# Patient Record
Sex: Female | Born: 1985
Health system: Southern US, Community
[De-identification: ages and names within clinical notes are randomized; demographics above are authoritative.]

## PROBLEM LIST (undated history)

## (undated) DIAGNOSIS — Z789 Other specified health status: Secondary | ICD-10-CM

## (undated) DIAGNOSIS — R87629 Unspecified abnormal cytological findings in specimens from vagina: Secondary | ICD-10-CM

## (undated) DIAGNOSIS — F419 Anxiety disorder, unspecified: Secondary | ICD-10-CM

## (undated) HISTORY — DX: Unspecified abnormal cytological findings in specimens from vagina: R87.629

## (undated) HISTORY — PX: OTHER SURGICAL HISTORY: SHX169

## (undated) HISTORY — DX: Anxiety disorder, unspecified: F41.9

---

## 2008-09-29 ENCOUNTER — Ambulatory Visit (HOSPITAL_COMMUNITY): Admission: RE | Admit: 2008-09-29 | Discharge: 2008-09-29 | Payer: Self-pay | Admitting: Obstetrics and Gynecology

## 2008-10-25 ENCOUNTER — Inpatient Hospital Stay (HOSPITAL_COMMUNITY): Admission: AD | Admit: 2008-10-25 | Discharge: 2008-10-25 | Payer: Self-pay | Admitting: Obstetrics and Gynecology

## 2008-11-01 ENCOUNTER — Ambulatory Visit (HOSPITAL_COMMUNITY): Admission: RE | Admit: 2008-11-01 | Discharge: 2008-11-01 | Payer: Self-pay | Admitting: Obstetrics and Gynecology

## 2008-11-04 ENCOUNTER — Ambulatory Visit (HOSPITAL_COMMUNITY): Admission: RE | Admit: 2008-11-04 | Discharge: 2008-11-04 | Payer: Self-pay | Admitting: Obstetrics and Gynecology

## 2008-11-11 ENCOUNTER — Ambulatory Visit (HOSPITAL_COMMUNITY): Admission: RE | Admit: 2008-11-11 | Discharge: 2008-11-11 | Payer: Self-pay | Admitting: Obstetrics and Gynecology

## 2008-11-15 ENCOUNTER — Inpatient Hospital Stay (HOSPITAL_COMMUNITY): Admission: RE | Admit: 2008-11-15 | Discharge: 2008-11-17 | Payer: Self-pay | Admitting: Obstetrics and Gynecology

## 2010-06-01 LAB — CBC
Hemoglobin: 11.8 g/dL — ABNORMAL LOW (ref 12.0–15.0)
MCHC: 33.6 g/dL (ref 30.0–36.0)
MCHC: 33.7 g/dL (ref 30.0–36.0)
MCV: 88.3 fL (ref 78.0–100.0)
Platelets: 224 10*3/uL (ref 150–400)
Platelets: 247 10*3/uL (ref 150–400)
RDW: 13.4 % (ref 11.5–15.5)
WBC: 13.2 10*3/uL — ABNORMAL HIGH (ref 4.0–10.5)

## 2010-06-01 LAB — RH IMMUNE GLOB WKUP(>/=20WKS)(NOT WOMEN'S HOSP)

## 2010-09-05 ENCOUNTER — Encounter: Payer: Self-pay | Admitting: Family Medicine

## 2010-11-24 ENCOUNTER — Inpatient Hospital Stay (INDEPENDENT_AMBULATORY_CARE_PROVIDER_SITE_OTHER)
Admission: RE | Admit: 2010-11-24 | Discharge: 2010-11-24 | Disposition: A | Discharge status: Home / Self Care | Payer: 59 | Source: Ambulatory Visit | Attending: Emergency Medicine | Admitting: Emergency Medicine

## 2010-11-24 DIAGNOSIS — J069 Acute upper respiratory infection, unspecified: Secondary | ICD-10-CM

## 2010-11-27 IMAGING — US US UA DOPPLER RE-EVAL
1 series · 18 of 23 positions shown · non-contrast
Comparison: none

OBSTETRICAL ULTRASOUND:
 This ultrasound was performed in The [HOSPITAL], and the [facility] report will be stored to [REDACTED] PACS.

[Series 1: us ua doppler re-eval · 18 of 23 slices shown]
[im 1/23]
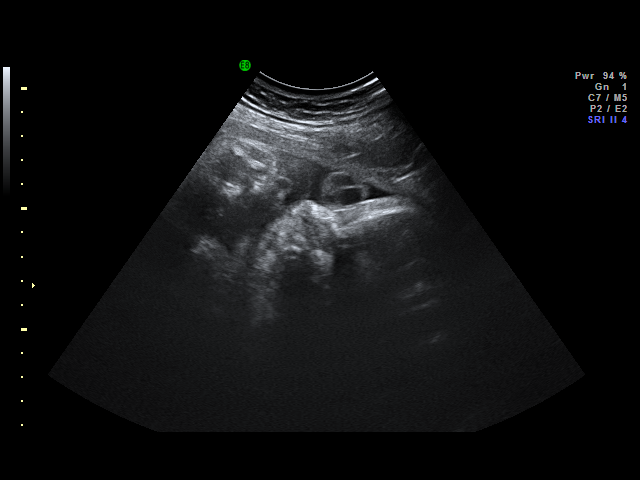
[im 2/23]
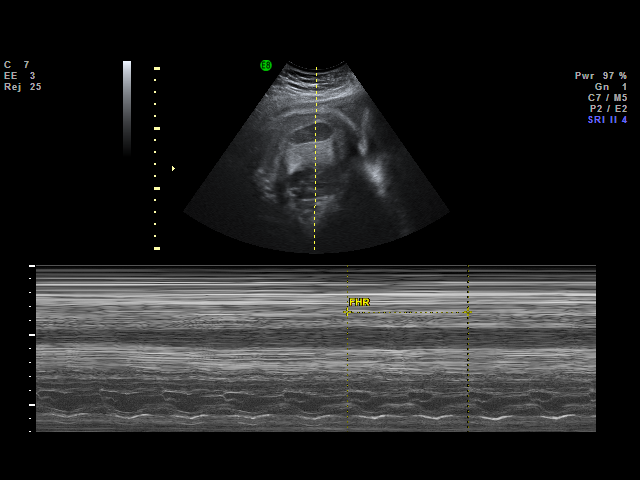
[im 4/23]
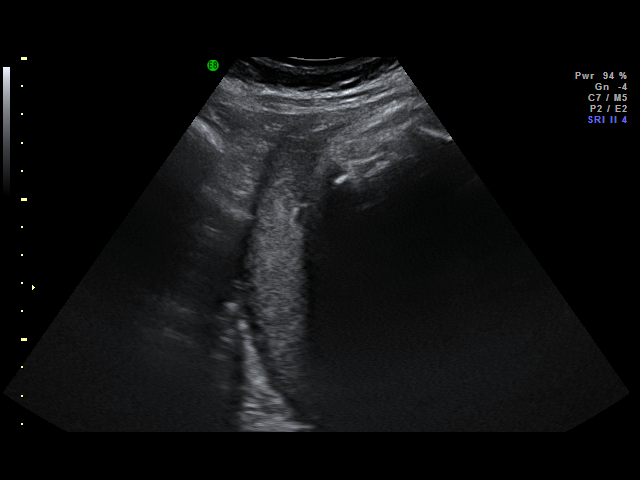
[im 5/23]
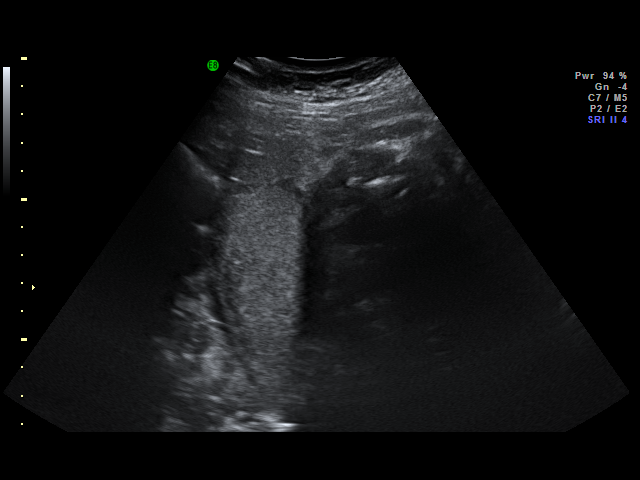
[im 6/23]
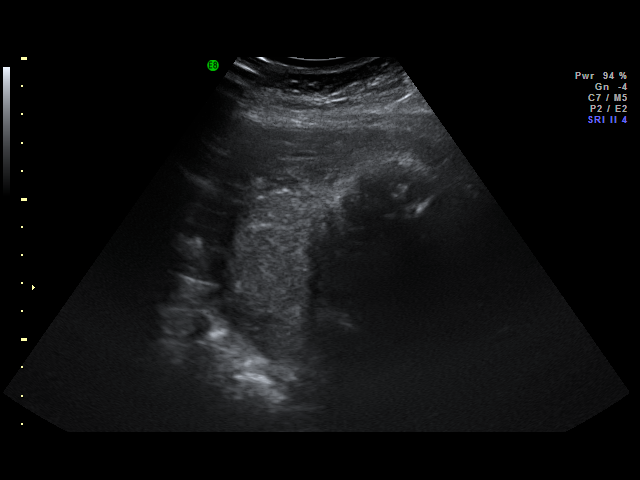
[im 8/23]
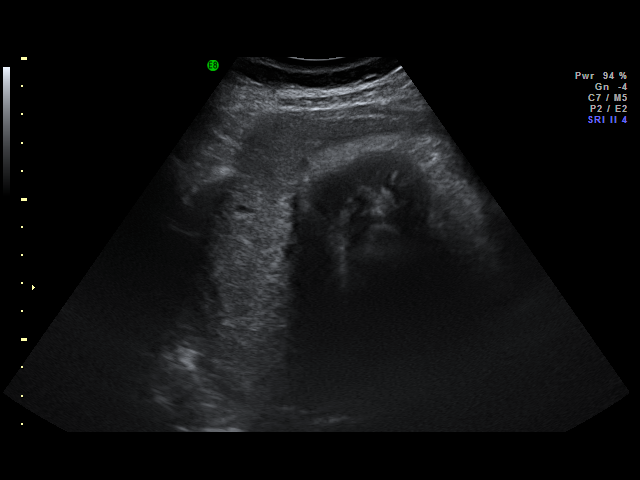
[im 9/23]
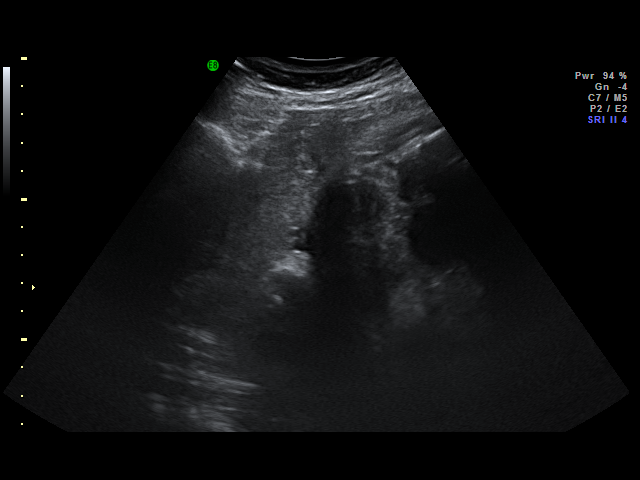
[im 10/23]
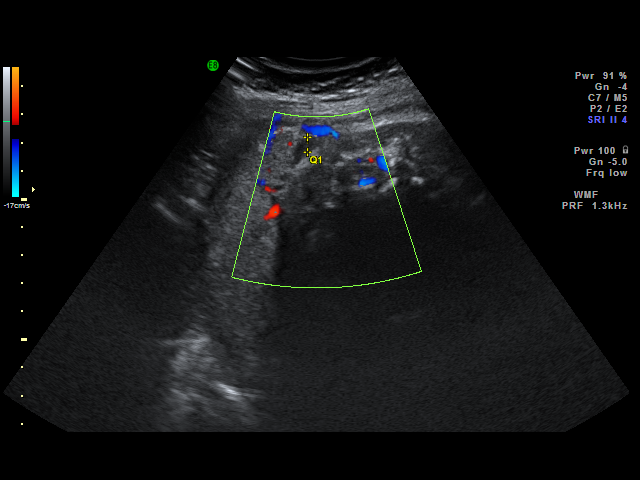
[im 11/23]
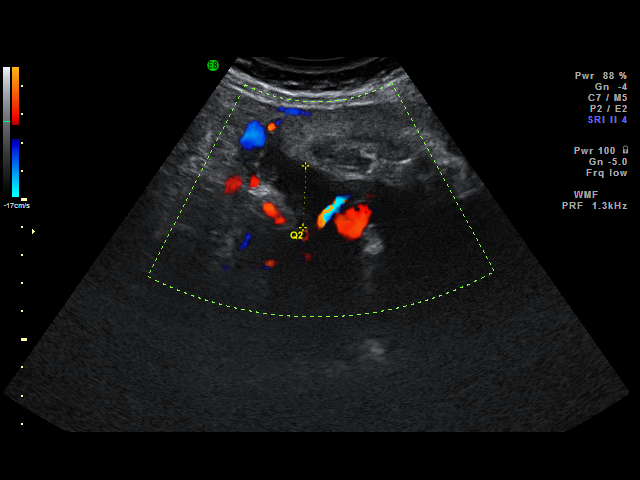
[im 13/23]
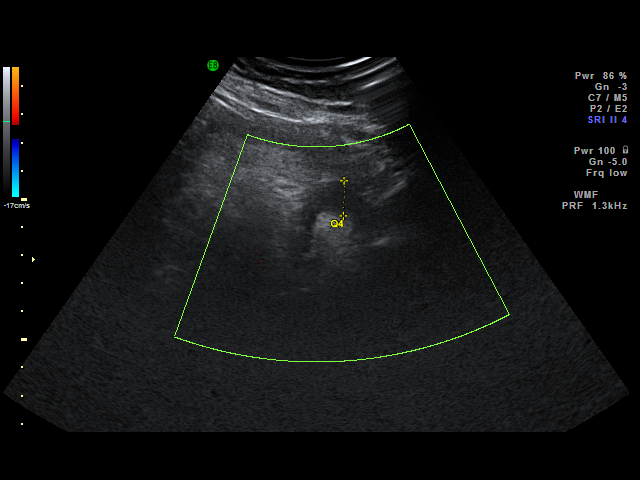
[im 14/23]
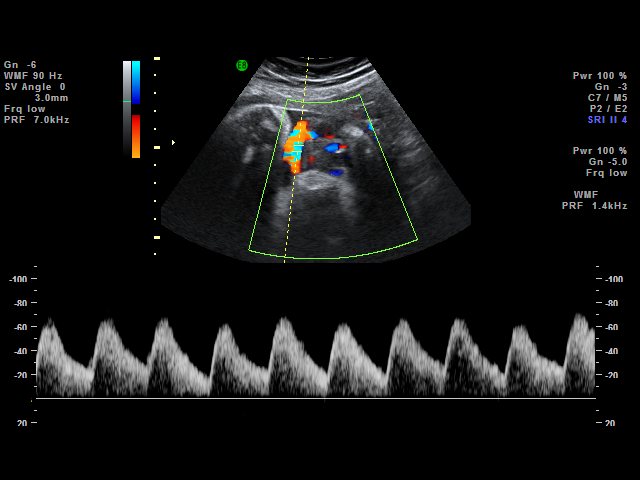
[im 15/23]
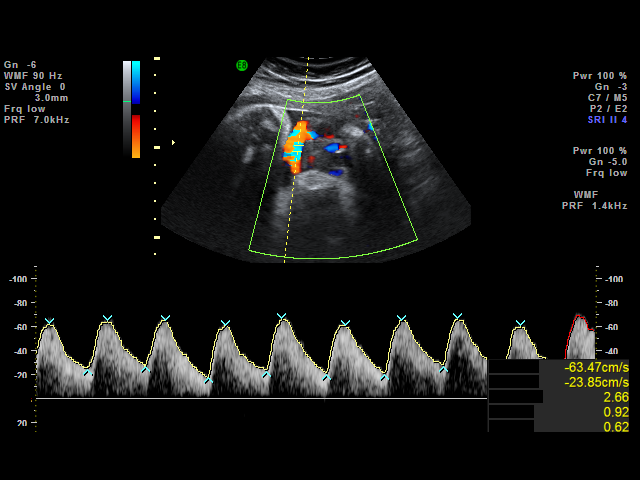
[im 16/23]
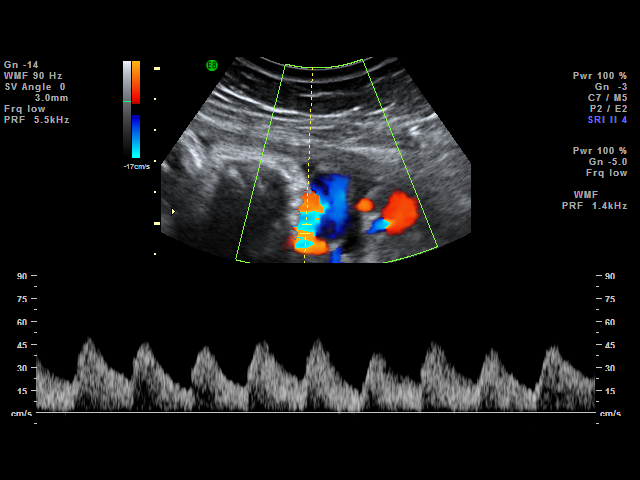
[im 18/23]
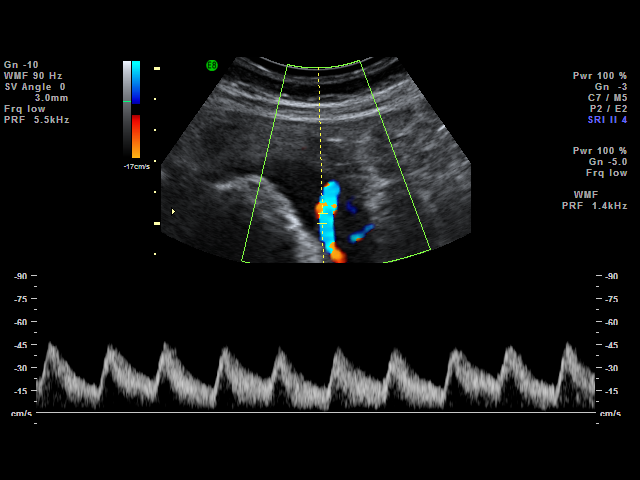
[im 19/23]
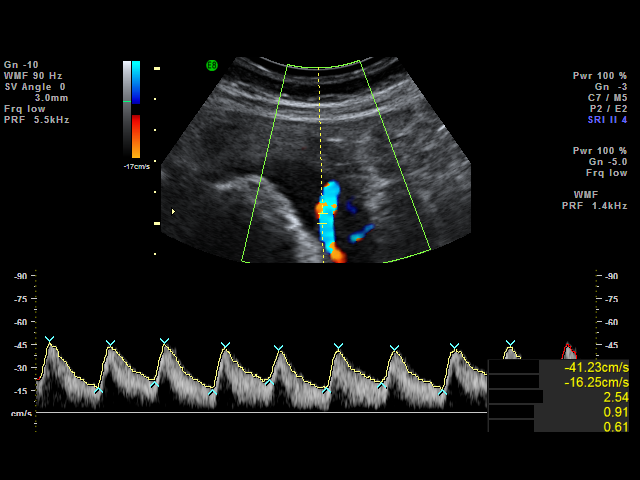
[im 20/23]
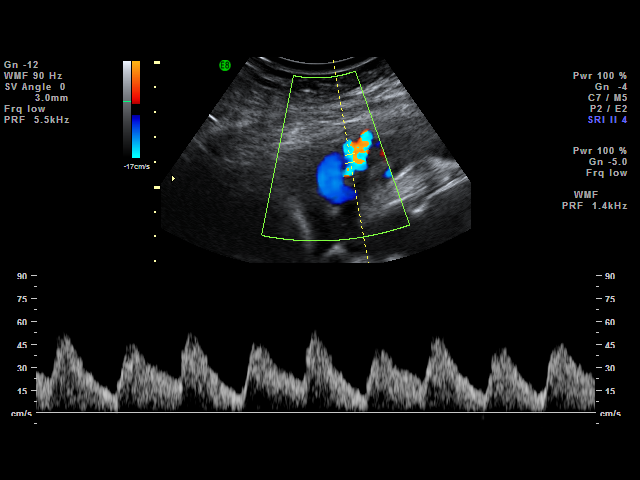
[im 22/23]
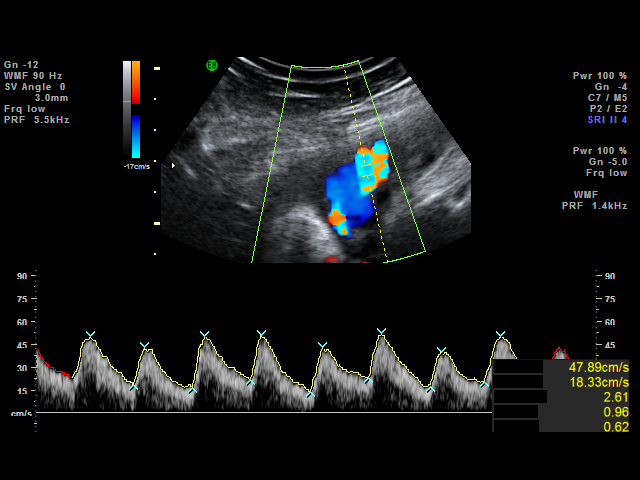
[im 23/23]
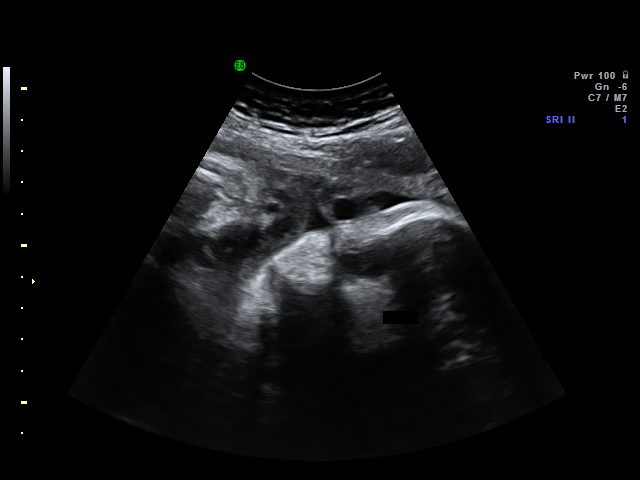

[18 of 23 positions shown; findings below may reference images not displayed]

IMPRESSION: [facility] has also been faxed to the ordering physician.

## 2010-12-07 IMAGING — US US OB LIMITED
1 series · 14 of 23 positions shown · non-contrast
Comparison: none

OBSTETRICAL ULTRASOUND:
 This ultrasound was performed in The [HOSPITAL], and the [facility] report will be stored to [REDACTED] PACS.  This report is also available in [HOSPITAL]?s accessANYware.

[Series 1: us ob limited · 14 of 23 slices shown]
[im 1/23]
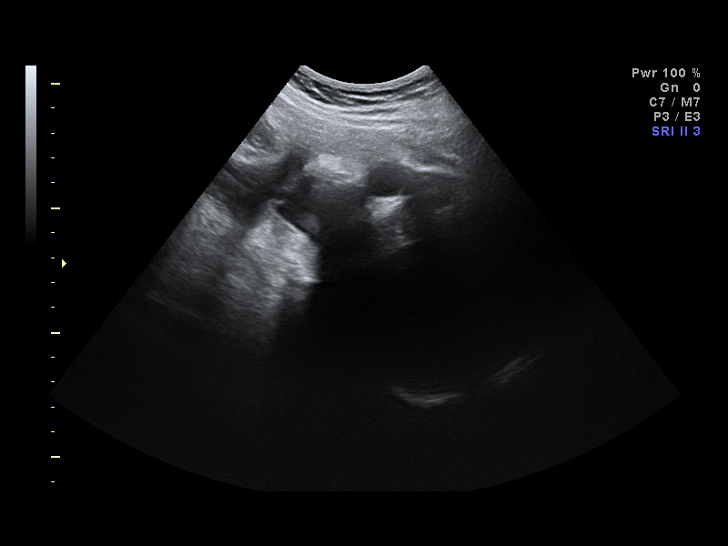
[im 3/23]
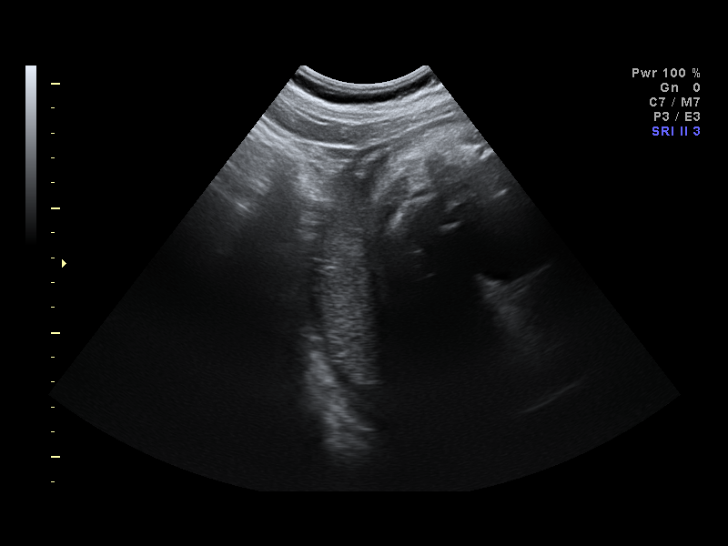
[im 5/23]
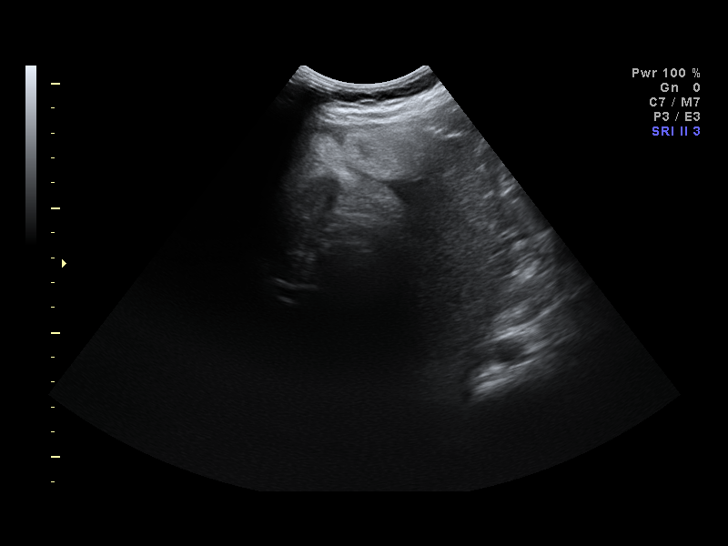
[im 6/23]
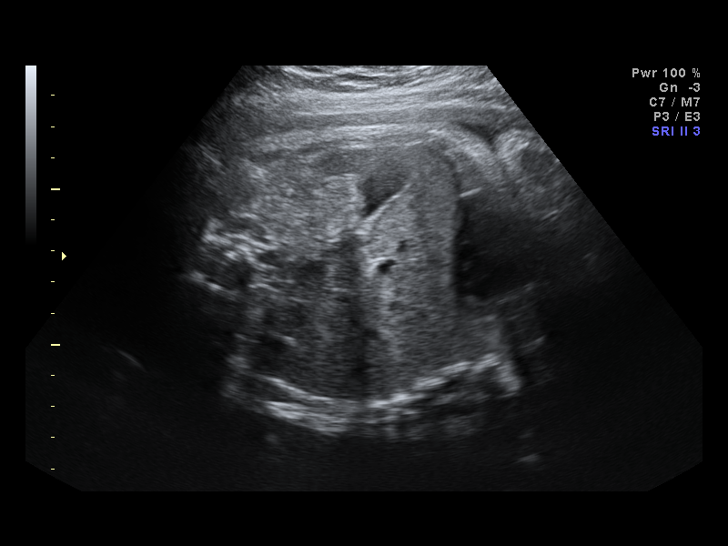
[im 8/23]
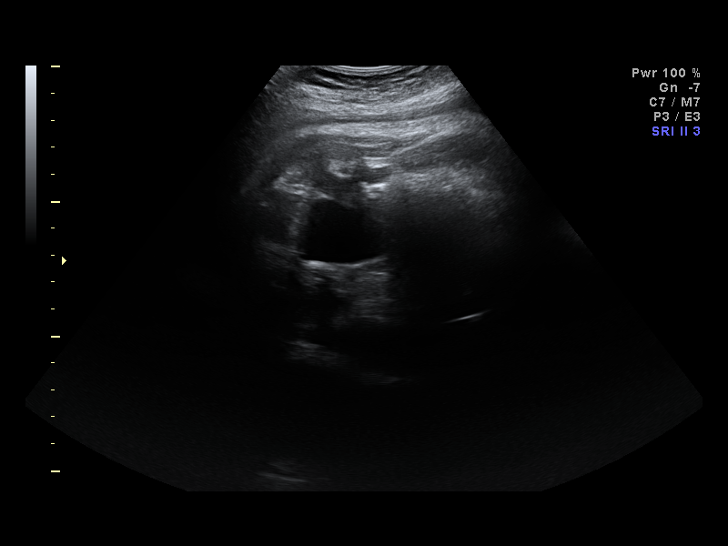
[im 10/23]
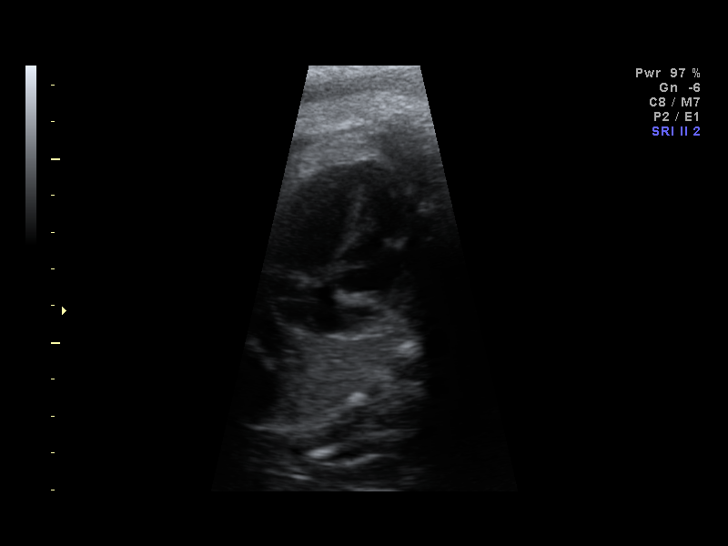
[im 11/23]
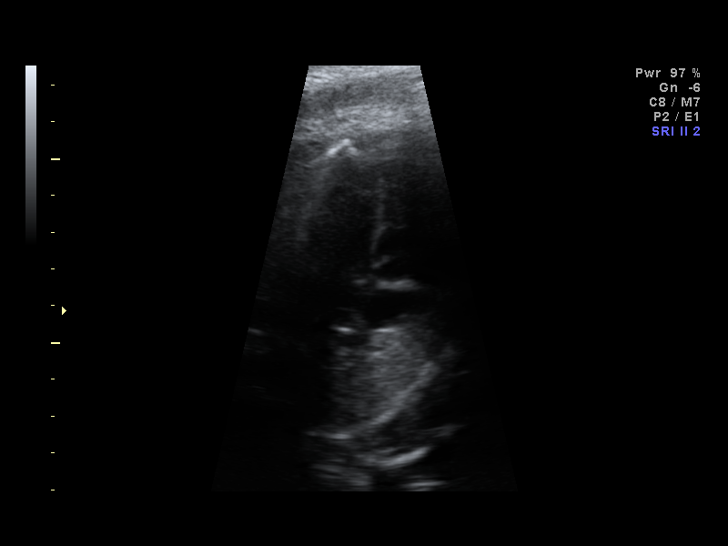
[im 13/23]
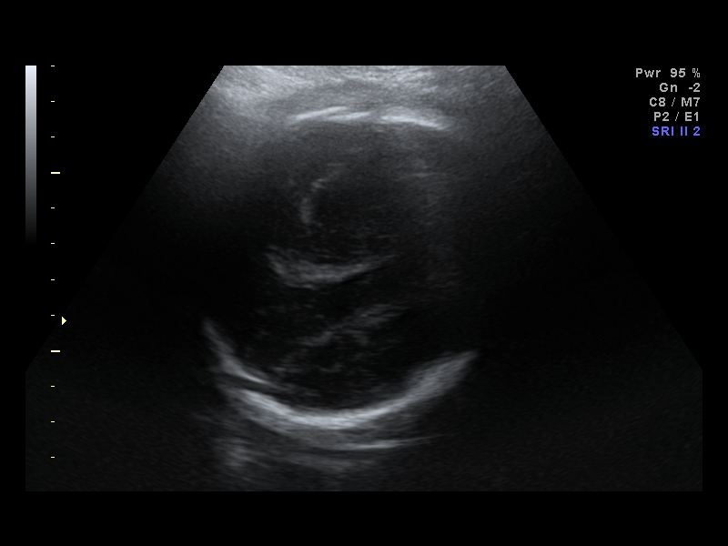
[im 14/23]
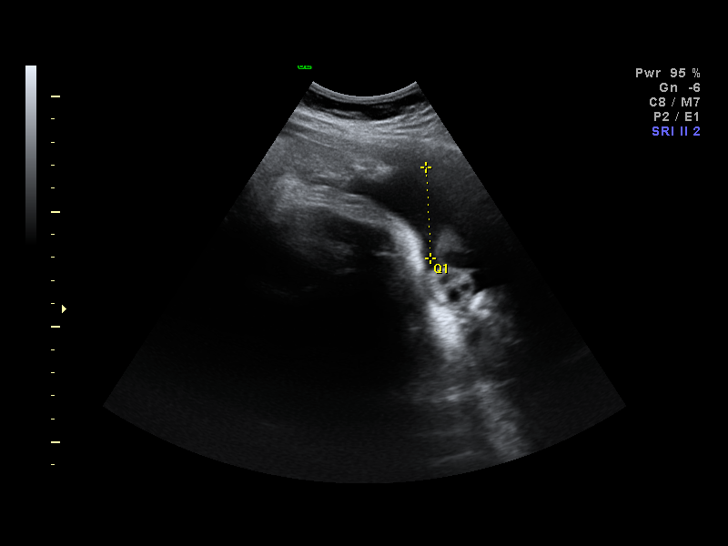
[im 16/23]
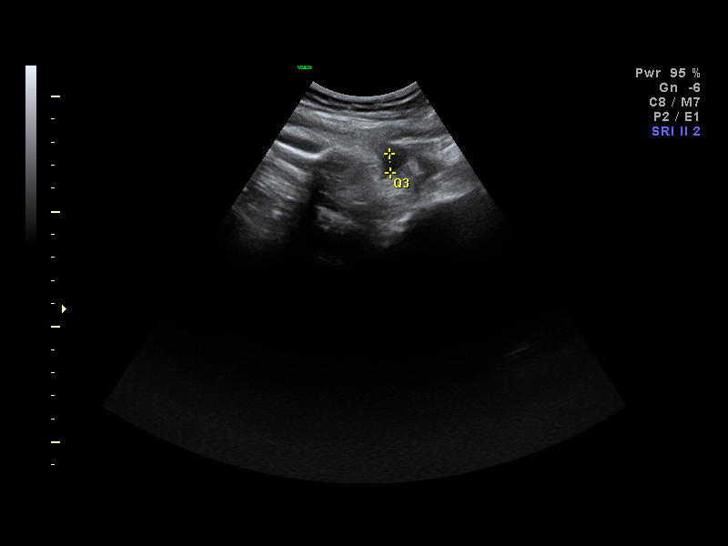
[im 18/23]
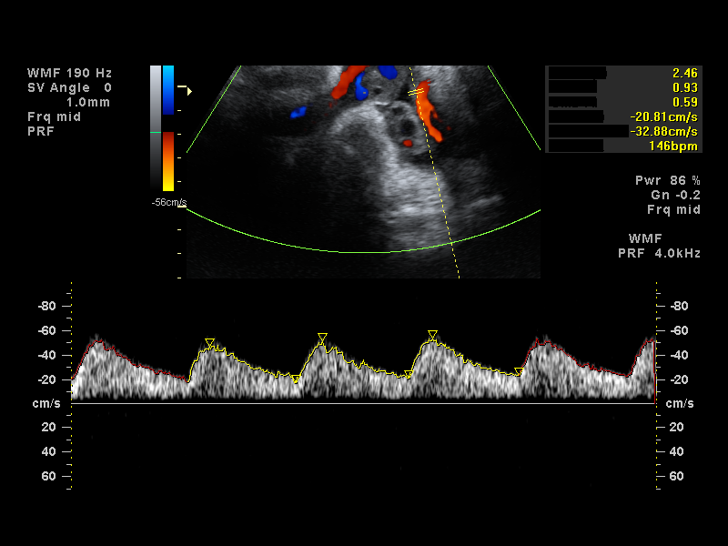
[im 19/23]
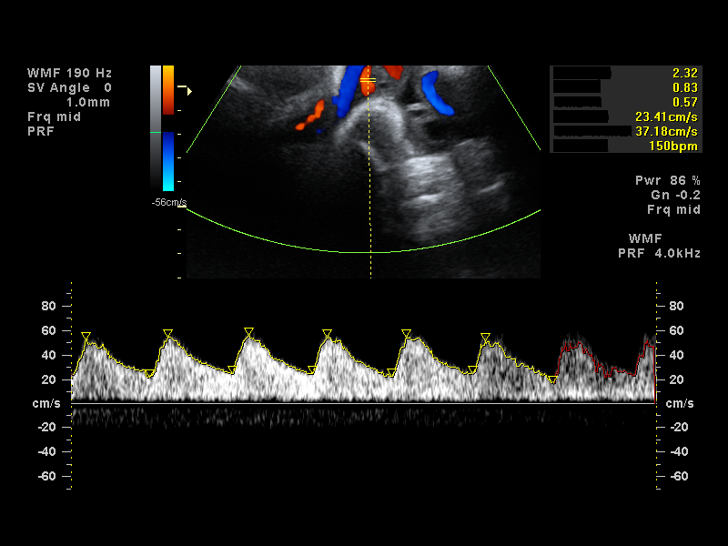
[im 21/23]
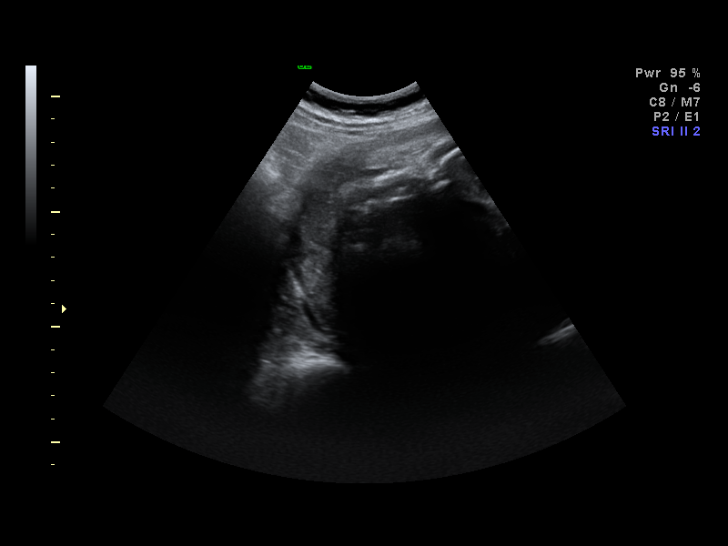
[im 23/23]
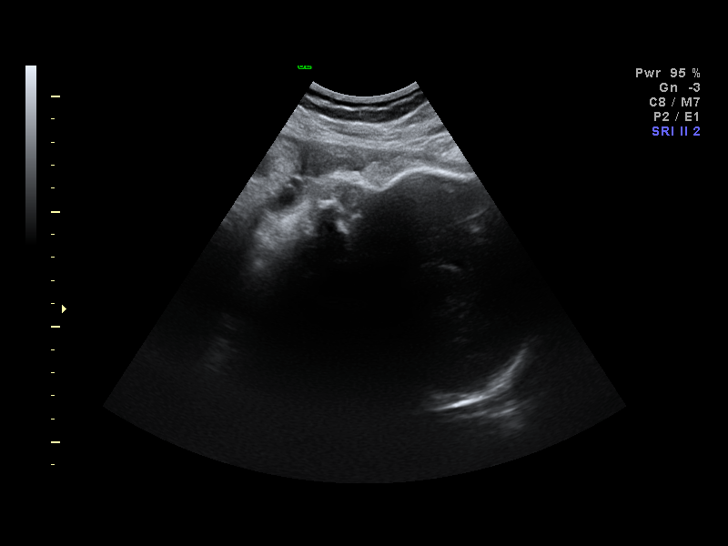

[14 of 23 positions shown; findings below may reference images not displayed]

IMPRESSION: [facility] has also been faxed to the ordering physician.

## 2011-01-17 ENCOUNTER — Inpatient Hospital Stay (HOSPITAL_COMMUNITY)
Admission: AD | Admit: 2011-01-17 | Discharge: 2011-01-17 | Disposition: A | Discharge status: Home / Self Care | Payer: 59 | Source: Ambulatory Visit | Attending: Obstetrics and Gynecology | Admitting: Obstetrics and Gynecology

## 2011-01-17 ENCOUNTER — Encounter (HOSPITAL_COMMUNITY): Payer: Self-pay | Admitting: *Deleted

## 2011-01-17 DIAGNOSIS — O4700 False labor before 37 completed weeks of gestation, unspecified trimester: Secondary | ICD-10-CM | POA: Insufficient documentation

## 2011-01-17 HISTORY — DX: Other specified health status: Z78.9

## 2011-01-17 LAB — URINE MICROSCOPIC-ADD ON

## 2011-01-17 LAB — URINALYSIS, ROUTINE W REFLEX MICROSCOPIC
Glucose, UA: NEGATIVE mg/dL
Hgb urine dipstick: NEGATIVE
Specific Gravity, Urine: >1.03 — ABNORMAL HIGH (ref 1.005–1.030)

## 2011-01-17 LAB — FETAL FIBRONECTIN: Fetal Fibronectin: NEGATIVE

## 2011-01-17 MED ORDER — LACTATED RINGERS IV SOLN
Freq: Once | INTRAVENOUS | Status: AC
  Administered 2011-01-17: 08:00:00 via INTRAVENOUS

## 2011-01-17 NOTE — ED Notes (Signed)
Madeline Owens is a 25 y.o. G2P1001 at [redacted]w[redacted]d by LMP here for contractions  Subjective:    Not feeling ctx's, lots of FM, no LOF or bleeding  Objective: BP 110/69  Pulse 90  Temp 98 F (36.7 C)  Resp 16  Ht 5\' 3"  (1.6 m)  Wt 64.864 kg (143 lb)  BMI 25.33 kg/m2       FHT:  FHR: 140 bpm, variability: moderate,  accelerations:  Present,  decelerations:  Absent UC:   irregular, every 5-8 min minutes and irritability SVE:   Dilation: 1.5 Effacement (%): 60 Station: -1 Exam by:: Renae Fickle, CNM  Labs: Results for orders placed during the hospital encounter of 01/17/11 (from the past 24 hour(s))  URINALYSIS, ROUTINE W REFLEX MICROSCOPIC     Status: Abnormal   Collection Time   01/17/11  5:55 AM      Component Value Range   Color, Urine YELLOW  YELLOW    Appearance CLEAR  CLEAR    Specific Gravity, Urine >1.030 (*) 1.005 - 1.030    pH 6.0  5.0 - 8.0    Glucose, UA NEGATIVE  NEGATIVE (mg/dL)   Hgb urine dipstick NEGATIVE  NEGATIVE    Bilirubin Urine NEGATIVE  NEGATIVE    Ketones, ur NEGATIVE  NEGATIVE (mg/dL)   Protein, ur NEGATIVE  NEGATIVE (mg/dL)   Urobilinogen, UA 0.2  0.0 - 1.0 (mg/dL)   Nitrite NEGATIVE  NEGATIVE    Leukocytes, UA TRACE (*) NEGATIVE   URINE MICROSCOPIC-ADD ON     Status: Abnormal   Collection Time   01/17/11  5:55 AM      Component Value Range   Squamous Epithelial / LPF MANY (*) RARE    WBC, UA 3-6  <3 (WBC/hpf)   Bacteria, UA RARE  RARE   FETAL FIBRONECTIN     Status: Normal   Collection Time   01/17/11  6:40 AM      Component Value Range   Fetal Fibronectin NEGATIVE  NEGATIVE     Assessment / Plan: IUP at [redacted]w[redacted]d, contractions  CVX exam unchanged fFN negative Low risk for PTB with previous term delivery and neg fFN today  D/C home after IV fluids complete PTL precautions reviewed F/U in office next week.  PAUL,DANIELA 01/17/2011, 7:54 AM

## 2011-01-17 NOTE — Progress Notes (Signed)
Colon Flattery, CNM at bedside.  Strip reviewed and poc discussed with pt.

## 2011-01-17 NOTE — Progress Notes (Signed)
FFN collected prior to cervical exam.

## 2011-01-17 NOTE — Progress Notes (Signed)
Pt G2 P1 at 32.2wks having contractions all night.  Pt denies bleeding, leaking or problems with pregnancy.

## 2011-01-17 NOTE — Progress Notes (Signed)
Notified of 32.2 weeks here for cramping all night.  Notified of hx of previa that pt states is resolved.  Orders for FFN and cervical check.  Will be up to see pt.

## 2011-01-17 NOTE — ED Provider Notes (Signed)
Madeline Owens is a 25 y.o. female at [redacted]w[redacted]d presenting for persistent contractions overnight. Reports ctx started last night, and continued every few minutes until presenting to hospital. Now does not feel any contractions. Denies LOF or VB. Reports good fetal movement. Drinks orange juice, minimal water. Had GI upset the day before with stomach cramps and loose stools which have now stopped. Hx SVD at 39 wks, IOL for oligo.   Maternal Medical History:  Reason for admission: Reason for Admission:   nausea  OB History    Grav Para Term Preterm Abortions TAB SAB Ect Mult Living   2 1 1  0 0 0 0 0 0 1     Past Medical History  Diagnosis Date  . No pertinent past medical history    Past Surgical History  Procedure Date  . Wisdom teeth    Family History: family history includes Diabetes in her maternal grandmother, paternal grandfather, and paternal grandmother; Heart disease in her paternal grandfather; and Hypertension in her father. Social History:  reports that she has never smoked. She does not have any smokeless tobacco history on file. She reports that she does not drink alcohol or use illicit drugs.  Review of Systems  Constitutional: Negative for fever.  Gastrointestinal: Negative for nausea, vomiting and abdominal pain.  Genitourinary: Negative for dysuria, urgency and frequency.  Neurological: Negative for headaches.    Dilation: 1 (external os 1cm, unable to reach internal) Effacement (%): 50;40 Station: -3 Exam by:: Humphrey Rolls, RN Blood pressure 110/69, pulse 90, temperature 98 F (36.7 C), resp. rate 16, height 5\' 3"  (1.6 m), weight 64.864 kg (143 lb). Maternal Exam:  Uterine Assessment: Contraction strength is mild.  Contraction frequency is irregular.  Q 5-7 min with irritability in between  Abdomen: Patient reports no abdominal tenderness. Fundal height is 30.   Fetal presentation: vertex  Introitus: Normal vulva. Normal vagina.    Fetal Exam Fetal Monitor  Review: Mode: ultrasound.   Baseline rate: 140.  Variability: moderate (6-25 bpm).   Pattern: accelerations present and no decelerations.    Fetal State Assessment: Category I - tracings are normal.     Physical Exam  Constitutional: She is oriented to person, place, and time. She appears well-developed and well-nourished.  HENT:  Head: Normocephalic.  Neck: Normal range of motion. Neck supple.  Cardiovascular: Normal rate and regular rhythm.   Respiratory: Effort normal and breath sounds normal.  GI: Soft.  Genitourinary: Vagina normal.  Musculoskeletal: Normal range of motion. She exhibits no edema.  Neurological: She is alert and oriented to person, place, and time.  Skin: Skin is warm and dry.  Psychiatric: She has a normal mood and affect.    Prenatal labs: ABO, Rh:  O neg Antibody:  neg Rubella:  immune RPR:   NR HBsAg:   NR HIV:   NR GBS:  (+)bacteriuria, treated 1GTT: nl Genetics: declined [place]: nl female anatomy, marginal placenta previa resolved by 28 wks  Assessment/Plan: IUP at [redacted]w[redacted]d Threatened PTL, low risk for PTB w/ previous term pregnancy, suspect GI virus sequelae  fFN pending Will hydrate IV LR 1L bolus  Plan D/C home w/ PTL precautions if fFN neg and contractions decrease< 6/hr Plan increased rest next couple of days.  PAUL,DANIELA 01/17/2011, 7:04 AM

## 2011-02-26 NOTE — L&D Delivery Note (Signed)
  Delivery Note  Complete dilation at 1215 with BBOW - AROM with clear fluid Received ampicillin 2gm IVPB x 1 dose for + GBS status Onset of pushing at 1225 FHR second stage 100-110  Anesthesia: epidural  Delivery of a viable female at 59 by CNM in ROA position No nuchal cord Cord double clamped after cessation of pulsation, cut by FOB Cord blood sample & cord blood donation collected by [facility] Staff after placental delivery.  Placenta delivered shultz at 1249 intact with 3 VC.  Placenta sent from room for cord blood collection & subsequent disposal. Uterine tone firm/ bleeding small  small 1st degree perineal laceration identified - repair with 3-0 vicryl subcuticular superficial upper left labial LAC - single 4-0 suture placed for hemostasis  Est. Blood Loss (mL): 300  Complications: none  Apgar 9-9. Birth weight 7-5.  Mom to postpartum.  Baby to nursery-stable.  Deaire Mcwhirter 03/06/2011, 1:14 PM

## 2011-03-06 ENCOUNTER — Encounter (HOSPITAL_COMMUNITY): Payer: Self-pay | Admitting: Anesthesiology

## 2011-03-06 ENCOUNTER — Inpatient Hospital Stay (HOSPITAL_COMMUNITY)
Admission: AD | Admit: 2011-03-06 | Discharge: 2011-03-08 | DRG: 775 | Disposition: A | Discharge status: Home / Self Care | Payer: 59 | Source: Ambulatory Visit | Attending: Obstetrics and Gynecology | Admitting: Obstetrics and Gynecology

## 2011-03-06 ENCOUNTER — Encounter (HOSPITAL_COMMUNITY): Payer: Self-pay | Admitting: *Deleted

## 2011-03-06 ENCOUNTER — Inpatient Hospital Stay (HOSPITAL_COMMUNITY): Payer: 59 | Admitting: Anesthesiology

## 2011-03-06 DIAGNOSIS — O9903 Anemia complicating the puerperium: Secondary | ICD-10-CM | POA: Diagnosis not present

## 2011-03-06 DIAGNOSIS — IMO0001 Reserved for inherently not codable concepts without codable children: Secondary | ICD-10-CM

## 2011-03-06 DIAGNOSIS — D62 Acute posthemorrhagic anemia: Secondary | ICD-10-CM | POA: Diagnosis not present

## 2011-03-06 DIAGNOSIS — O99892 Other specified diseases and conditions complicating childbirth: Secondary | ICD-10-CM | POA: Diagnosis present

## 2011-03-06 DIAGNOSIS — Z22330 Carrier of Group B streptococcus: Secondary | ICD-10-CM

## 2011-03-06 LAB — CBC
HCT: 37.4 % (ref 36.0–46.0)
Hemoglobin: 12 g/dL (ref 12.0–15.0)
MCH: 27.5 pg (ref 26.0–34.0)
MCHC: 32.1 g/dL (ref 30.0–36.0)
MCV: 85.6 fL (ref 78.0–100.0)
Platelets: 218 10*3/uL (ref 150–400)
RBC: 4.37 MIL/uL (ref 3.87–5.11)
RDW: 14.2 % (ref 11.5–15.5)
WBC: 14 10*3/uL — ABNORMAL HIGH (ref 4.0–10.5)

## 2011-03-06 LAB — STREP B DNA PROBE: GBS: POSITIVE

## 2011-03-06 LAB — RPR: RPR Ser Ql: NONREACTIVE

## 2011-03-06 MED ORDER — DIBUCAINE 1 % RE OINT: 1.0000 "application " | TOPICAL_OINTMENT | RECTAL | Status: DC | PRN

## 2011-03-06 MED ORDER — LIDOCAINE HCL 1.5 % IJ SOLN
INTRAMUSCULAR | Status: DC | PRN
  Administered 2011-03-06 (×2): 5 mL via EPIDURAL

## 2011-03-06 MED ORDER — ACETAMINOPHEN 325 MG PO TABS: 650.0000 mg | ORAL_TABLET | ORAL | Status: DC | PRN

## 2011-03-06 MED ORDER — EPHEDRINE 5 MG/ML INJ: 10.0000 mg | INTRAVENOUS | Status: DC | PRN

## 2011-03-06 MED ORDER — ONDANSETRON HCL 4 MG/2ML IJ SOLN: 4.0000 mg | INTRAMUSCULAR | Status: DC | PRN

## 2011-03-06 MED ORDER — SODIUM CHLORIDE 0.9 % IV SOLN
2.0000 g | Freq: Once | INTRAVENOUS | Status: AC
  Administered 2011-03-06: 2 g via INTRAVENOUS
  Filled 2011-03-06: qty 2000

## 2011-03-06 MED ORDER — IBUPROFEN 600 MG PO TABS
600.0000 mg | ORAL_TABLET | Freq: Four times a day (QID) | ORAL | Status: DC
  Administered 2011-03-06 – 2011-03-08 (×7): 600 mg via ORAL
  Filled 2011-03-06 (×7): qty 1

## 2011-03-06 MED ORDER — HYDROCORTISONE 2.5 % RE CREA
TOPICAL_CREAM | Freq: Three times a day (TID) | RECTAL | Status: DC
  Administered 2011-03-08: 11:00:00 via RECTAL
  Filled 2011-03-06: qty 28.35

## 2011-03-06 MED ORDER — OXYCODONE-ACETAMINOPHEN 5-325 MG PO TABS: 1.0000 | ORAL_TABLET | ORAL | Status: DC | PRN

## 2011-03-06 MED ORDER — LACTATED RINGERS IV SOLN
INTRAVENOUS | Status: DC
  Administered 2011-03-06: 10:00:00 via INTRAVENOUS

## 2011-03-06 MED ORDER — BENZOCAINE-MENTHOL 20-0.5 % EX AERO: 1.0000 "application " | INHALATION_SPRAY | CUTANEOUS | Status: DC | PRN

## 2011-03-06 MED ORDER — OXYTOCIN 20 UNITS IN LACTATED RINGERS INFUSION - SIMPLE
500.0000 mL/h | INTRAVENOUS | Status: DC
  Administered 2011-03-06: 500 mL/h via INTRAVENOUS
  Filled 2011-03-06: qty 1000

## 2011-03-06 MED ORDER — IBUPROFEN 600 MG PO TABS: 600.0000 mg | ORAL_TABLET | Freq: Four times a day (QID) | ORAL | Status: DC | PRN

## 2011-03-06 MED ORDER — ONDANSETRON HCL 4 MG PO TABS: 4.0000 mg | ORAL_TABLET | ORAL | Status: DC | PRN

## 2011-03-06 MED ORDER — WITCH HAZEL-GLYCERIN EX PADS: 1.0000 "application " | MEDICATED_PAD | CUTANEOUS | Status: DC | PRN

## 2011-03-06 MED ORDER — SENNOSIDES-DOCUSATE SODIUM 8.6-50 MG PO TABS
2.0000 | ORAL_TABLET | Freq: Every day | ORAL | Status: DC
  Administered 2011-03-06 – 2011-03-07 (×2): 2 via ORAL

## 2011-03-06 MED ORDER — EPHEDRINE 5 MG/ML INJ
INTRAVENOUS | Status: AC
  Filled 2011-03-06: qty 4

## 2011-03-06 MED ORDER — DIPHENHYDRAMINE HCL 25 MG PO CAPS: 25.0000 mg | ORAL_CAPSULE | Freq: Four times a day (QID) | ORAL | Status: DC | PRN

## 2011-03-06 MED ORDER — LANOLIN HYDROUS EX OINT: TOPICAL_OINTMENT | CUTANEOUS | Status: DC | PRN

## 2011-03-06 MED ORDER — LIDOCAINE HCL (PF) 1 % IJ SOLN
30.0000 mL | INTRAMUSCULAR | Status: DC | PRN
  Filled 2011-03-06: qty 30

## 2011-03-06 MED ORDER — FENTANYL 2.5 MCG/ML BUPIVACAINE 1/10 % EPIDURAL INFUSION (WH - ANES): 14.0000 mL/h | INTRAMUSCULAR | Status: DC

## 2011-03-06 MED ORDER — SIMETHICONE 80 MG PO CHEW: 80.0000 mg | CHEWABLE_TABLET | ORAL | Status: DC | PRN

## 2011-03-06 MED ORDER — CITRIC ACID-SODIUM CITRATE 334-500 MG/5ML PO SOLN: 30.0000 mL | ORAL | Status: DC | PRN

## 2011-03-06 MED ORDER — PHENYLEPHRINE 40 MCG/ML (10ML) SYRINGE FOR IV PUSH (FOR BLOOD PRESSURE SUPPORT): 80.0000 ug | PREFILLED_SYRINGE | INTRAVENOUS | Status: DC | PRN

## 2011-03-06 MED ORDER — SODIUM CHLORIDE 0.9 % IV SOLN
2.0000 g | Freq: Four times a day (QID) | INTRAVENOUS | Status: DC
  Filled 2011-03-06: qty 2000

## 2011-03-06 MED ORDER — FENTANYL 2.5 MCG/ML BUPIVACAINE 1/10 % EPIDURAL INFUSION (WH - ANES)
INTRAMUSCULAR | Status: AC
  Filled 2011-03-06: qty 60

## 2011-03-06 MED ORDER — LACTATED RINGERS IV SOLN: 500.0000 mL | Freq: Once | INTRAVENOUS | Status: DC

## 2011-03-06 MED ORDER — COMPLETENATE 29-1 MG PO CHEW
1.0000 | CHEWABLE_TABLET | Freq: Every day | ORAL | Status: DC
  Administered 2011-03-08: 1 via ORAL
  Filled 2011-03-06 (×3): qty 1

## 2011-03-06 MED ORDER — PHENYLEPHRINE 40 MCG/ML (10ML) SYRINGE FOR IV PUSH (FOR BLOOD PRESSURE SUPPORT)
80.0000 ug | PREFILLED_SYRINGE | INTRAVENOUS | Status: DC | PRN
  Filled 2011-03-06: qty 5

## 2011-03-06 MED ORDER — DIPHENHYDRAMINE HCL 50 MG/ML IJ SOLN: 12.5000 mg | INTRAMUSCULAR | Status: DC | PRN

## 2011-03-06 MED ORDER — FENTANYL 2.5 MCG/ML BUPIVACAINE 1/10 % EPIDURAL INFUSION (WH - ANES)
INTRAMUSCULAR | Status: DC | PRN
  Administered 2011-03-06: 14 mL/h via EPIDURAL

## 2011-03-06 MED ORDER — PHENYLEPHRINE 40 MCG/ML (10ML) SYRINGE FOR IV PUSH (FOR BLOOD PRESSURE SUPPORT)
PREFILLED_SYRINGE | INTRAVENOUS | Status: AC
  Filled 2011-03-06: qty 5

## 2011-03-06 NOTE — Progress Notes (Signed)
S: Feeling no pain since epidural / no pressure or urge to push     Epidural in place / single dose ampicillin 2gm IVPB infused for GBS prophylaxis  O:  VS: Blood pressure 119/66, pulse 226, temperature 97.4 F (36.3 C), temperature source Oral, resp. rate 20, height 5' 2.5" (1.588 m), weight 67.586 kg (149 lb), SpO2 100.00%.        FHR : baseline 130 / variability moderate/ accels +         Toco: contractions every 2-3 minutes         Cervix : 10/100/vtx/ BBOW +2        Membranes: AROM - clear  A: Active labor     FHR category 1  P: Begin second stage - anticipate SVB     Susa Loffler, MSN

## 2011-03-06 NOTE — Anesthesia Preprocedure Evaluation (Signed)
Anesthesia Evaluation  Patient identified by MRN, date of birth, ID band Patient awake    Reviewed: Allergy & Precautions, H&P , NPO status , Patient's Chart, lab work & pertinent test results  Airway Mallampati: I TM Distance: >3 FB Neck ROM: full    Dental No notable dental hx.    Pulmonary neg pulmonary ROS,    Pulmonary exam normal       Cardiovascular neg cardio ROS     Neuro/Psych Negative Neurological ROS  Negative Psych ROS   GI/Hepatic negative GI ROS, Neg liver ROS,   Endo/Other  Negative Endocrine ROS  Renal/GU negative Renal ROS  Genitourinary negative   Musculoskeletal   Abdominal Normal abdominal exam  (+)   Peds negative pediatric ROS (+)  Hematology negative hematology ROS (+)   Anesthesia Other Findings   Reproductive/Obstetrics (+) Pregnancy                           Anesthesia Physical Anesthesia Plan  ASA: II  Anesthesia Plan: Epidural   Post-op Pain Management:    Induction:   Airway Management Planned:   Additional Equipment:   Intra-op Plan:   Post-operative Plan:   Informed Consent: I have reviewed the patients History and Physical, chart, labs and discussed the procedure including the risks, benefits and alternatives for the proposed anesthesia with the patient or authorized representative who has indicated his/her understanding and acceptance.     Plan Discussed with:   Anesthesia Plan Comments:         Anesthesia Quick Evaluation  

## 2011-03-06 NOTE — H&P (Signed)
  OB ADMISSION/ HISTORY & PHYSICAL:  Admission Date: 03/06/2011  9:55 AM  Admit Diagnosis 39 weeks Active labor  Madeline Owens is a 26 y.o. female presented to office for labor check - onset of ctx at 0530. Upon exam in office, cervix 7-8 cm / 90% / vtx / +1 / BBOW. Sent to [facility] for direct admit to L&D with placement of epidural.  Prenatal History: G2P1001   EDC : 03/12/2011, Alternate EDD Entry  Prenatal care at [facility]  Prenatal course complicated by + GBS bacturia  Prenatal Labs: ABO, Rh:   O negative Antibody:  Negative Rubella:   Immune RPR:   NR HBsAg:   NR HIV:   NR GBS:   POSITIVE per urine cx 1 hr Glucola : NL   Medical / Surgical History :  Past medical history:  Past Medical History  Diagnosis Date  . No pertinent past medical history      Past surgical history:  Past Surgical History  Procedure Date  . Wisdom teeth      Family History:  Family History  Problem Relation Age of Onset  . Hypertension Father   . Diabetes Maternal Grandmother   . Diabetes Paternal Grandmother   . Diabetes Paternal Grandfather   . Heart disease Paternal Grandfather      Social History:  reports that she has never smoked. She does not have any smokeless tobacco history on file. She reports that she does not drink alcohol or use illicit drugs.   Allergies: Review of patient's allergies indicates no known allergies.    Current Medications at time of admission:  none  Review of Systems: Onset of active labor Ctx onset 0530 + bloody show in past hour No LOF + active FM  Physical Exam:   General:uncomfortable with ctx / calm Heart:RR Lungs: tachypnea with ctx / clear lung fields Abdomen:gravid / non-tender / uterus non-tender - firm with ctx (palpated strong lasting 60-90 sec) Extremities: no edema Genitalia / VE: + bloody show / 7-8cm / 90% / vtx / +1 / BBOW  FHR: 130 / moderate variability / + accels  / no decels TOCO: ctx 3-5 minutes  / strong     Assessment: Active Labor + GBS carrier status Desires epidural  Plan:  Admit Ampicillin IVPB 2 gm now Epidural AROM after ABX and epidural  Sally-Anne Wamble 03/06/2011, 10:03 AM

## 2011-03-06 NOTE — Anesthesia Procedure Notes (Signed)
Epidural Patient location during procedure: OB Start time: 03/06/2011 10:45 AM End time: 03/06/2011 10:51 AM Reason for block: procedure for pain  Staffing Anesthesiologist: Sandrea Hughs Performed by: anesthesiologist   Preanesthetic Checklist Completed: patient identified, site marked, surgical consent, pre-op evaluation, timeout performed, IV checked, risks and benefits discussed and monitors and equipment checked  Epidural Patient position: sitting Prep: site prepped and draped and DuraPrep Patient monitoring: continuous pulse ox and blood pressure Approach: midline Injection technique: LOR air  Needle:  Needle type: Tuohy  Needle gauge: 17 G Needle length: 9 cm Needle insertion depth: 4 cm Catheter type: closed end flexible Catheter size: 19 Gauge Catheter at skin depth: 9 cm Test dose: negative and 1.5% lidocaine  Assessment Sensory level: T10 Events: blood not aspirated, injection not painful, no injection resistance, negative IV test and no paresthesia

## 2011-03-06 NOTE — H&P (Signed)
Reviewed, agree 

## 2011-03-07 ENCOUNTER — Encounter (HOSPITAL_COMMUNITY): Payer: Self-pay | Admitting: *Deleted

## 2011-03-07 LAB — RUBELLA ANTIBODY, IGM: Rubella: IMMUNE

## 2011-03-07 LAB — HEPATITIS B SURFACE ANTIGEN: Hepatitis B Surface Ag: NEGATIVE

## 2011-03-07 LAB — CBC
HCT: 31 % — ABNORMAL LOW (ref 36.0–46.0)
Hemoglobin: 9.7 g/dL — ABNORMAL LOW (ref 12.0–15.0)
MCH: 27 pg (ref 26.0–34.0)
MCHC: 31.3 g/dL (ref 30.0–36.0)
MCV: 86.4 fL (ref 78.0–100.0)
Platelets: 193 10*3/uL (ref 150–400)
RBC: 3.59 MIL/uL — ABNORMAL LOW (ref 3.87–5.11)
RDW: 14.6 % (ref 11.5–15.5)
WBC: 10.8 10*3/uL — ABNORMAL HIGH (ref 4.0–10.5)

## 2011-03-07 LAB — ABO/RH

## 2011-03-07 NOTE — Progress Notes (Signed)
Patient ID: Madeline Owens, female   DOB: [birth year], 26 y.o.   MRN: [id number]  PPD 1 SVD  S:  Reports feeling well - slept some / minimal cramps and soreness             Tolerating po/ No nausea or vomiting             Bleeding is light             Pain controlled withprescription NSAID's including motrin             Up ad lib / ambulatory  Newborn breast feeding  / Circumcision requested   O:  A & O x 3 NAD             VS: Blood pressure 102/65, pulse 78, temperature 98.1 F (36.7 C), temperature source Oral, resp. rate 18, height 5' 2.5" (1.588 m), weight 67.586 kg (149 lb), SpO2 98.00%, unknown if currently breastfeeding.  LABS: WBC/Hgb/Hct/Plts:  10.8/9.7/31.0/193 (01/10 0545)   Lungs: Clear and unlabored  Heart: regular rate and rhythm / no mumurs  Abdomen: soft, non-tender, non-distended              Fundus: firm, non-tender, U-1  Perineum: ice pack in place  Lochia: light  Extremities: no edema, no calf pain or tenderness    A: PPD # 1   Doing well - stable status  P:  Routine post partum orders  Anticipate discharge in am             Newborn circ today  Mikaiah Stoffer 03/07/2011, 9:02 AM

## 2011-03-07 NOTE — Anesthesia Postprocedure Evaluation (Signed)
Anesthesia Post Note  Patient: Madeline Owens  Procedure(s) Performed: * No procedures listed *  Anesthesia type: Epidural  Patient location: Mother/Baby  Post pain: Pain level controlled  Post assessment: Post-op Vital signs reviewed  Last Vitals:  Filed Vitals:   03/07/11 0400  BP: 102/65  Pulse: 78  Temp: 36.7 C  Resp: 18    Post vital signs: Reviewed  Level of consciousness: awake  Complications: No apparent anesthesia complications

## 2011-03-07 NOTE — Addendum Note (Signed)
Addendum  created 03/07/11 0754 by Sandrea Hughs., MD   Modules edited:Notes Section

## 2011-03-08 ENCOUNTER — Encounter (HOSPITAL_COMMUNITY): Payer: Self-pay | Admitting: Obstetrics and Gynecology

## 2011-03-08 MED ORDER — POLYSACCHARIDE IRON 150 MG PO CAPS
150.0000 mg | ORAL_CAPSULE | Freq: Every day | ORAL | Status: DC
  Administered 2011-03-08: 150 mg via ORAL
  Filled 2011-03-08: qty 1

## 2011-03-08 MED ORDER — POLYSACCHARIDE IRON 150 MG PO CAPS: 150.0000 mg | ORAL_CAPSULE | Freq: Every day | ORAL | Status: DC

## 2011-03-08 MED ORDER — DOCUSATE SODIUM 100 MG PO CAPS: 100.0000 mg | ORAL_CAPSULE | Freq: Two times a day (BID) | ORAL | Status: AC

## 2011-03-08 MED ORDER — IBUPROFEN 600 MG PO TABS: 600.0000 mg | ORAL_TABLET | Freq: Four times a day (QID) | ORAL | Status: AC

## 2011-03-08 NOTE — Discharge Summary (Signed)
Obstetric Discharge Summary Reason for Admission: onset of labor Prenatal Procedures: ultrasound Intrapartum Procedures: spontaneous vaginal delivery Postpartum Procedures: none Complications-Operative and Postpartum: none Hemoglobin  Date Value Range Status  03/07/2011 9.7* 12.0-15.0 (g/dL) Final     DELTA CHECK NOTED     REPEATED TO VERIFY     HCT  Date Value Range Status  03/07/2011 31.0* 36.0-46.0 (%) Final    Discharge Diagnoses: Term Pregnancy-delivered  Discharge Information: Date: 03/08/2011 Activity: pelvic rest Diet: routine Medications: Ibuprofen, colace, niferex Condition: stable Instructions: refer to practice specific booklet Discharge to: home Follow-up Information    Follow up with BAILEY,TANYA in 6 weeks.   Contact information:   [address] [place] [address] [phone]          Newborn Data: Live born female on 03/06/11 Birth Weight: 7 lb 5.3 oz (3325 g) APGAR: 9, 9  Home with mother.  FISHER,JULIE K 03/08/2011, 9:28 AM  Reviewed, agree. --V.Juliene Pina, MD

## 2011-03-08 NOTE — Progress Notes (Signed)
Patient ID: ILYA ESS, female   DOB: [birth year], 26 y.o.   MRN: [id number] PPD # 2  Subjective: Pt reports feeling well and eager for d/c home/ Pain controlled with prescription NSAID's including motrin Tolerating po/ Voiding without problems/ No n/v Bleeding is light/ Newborn info:  Information for the patient's newborn:  Siren, Porrata Boy Jeidy [[id number]]  female  / circ complete/ Feeding: breast    Objective:  VS: Blood pressure 99/63, pulse 75, temperature 98.4 F (36.9 C), temperature source Oral, resp. rate 18    Basename 03/07/11 0545 03/06/11 1010  WBC 10.8* 14.0*  HGB 9.7* 12.0  HCT 31.0* 37.4  PLT 193 218    Blood type: O/Negative/-- (01/10 0000) Rubella: Immune (01/10 0000)    Physical Exam:  General: alert, cooperative and no distress Abdomen: soft, nontender, normal bowel sounds Uterine Fundus: firm, below umbilicus, nontender Perineum: not inspected Lochia: minimal Ext: Homans sign is negative, no sign of DVT and no edema, redness or tenderness in the calves or thighs   A/P: PPD # 2/ [id number] S/P SVD Mild ABL Anemia Doing well and stable for discharge home RX: Ibuprofen 600mg  po Q 6 hrs prn pain #30 Refill x 1 Ferralet 90 1 po QD #30 Refill x 1 WOB/GYN booklet given Routine pp visit in 6wks  Signed: Arlana Lindau, [facility] 03/08/11 0900

## 2011-03-11 ENCOUNTER — Encounter (HOSPITAL_COMMUNITY): Payer: 59

## 2011-06-25 ENCOUNTER — Encounter: Payer: Self-pay | Admitting: *Deleted

## 2011-06-26 ENCOUNTER — Encounter: Payer: Self-pay | Admitting: *Deleted

## 2011-06-26 ENCOUNTER — Ambulatory Visit (INDEPENDENT_AMBULATORY_CARE_PROVIDER_SITE_OTHER): Payer: 59 | Admitting: *Deleted

## 2011-06-26 DIAGNOSIS — I781 Nevus, non-neoplastic: Secondary | ICD-10-CM | POA: Insufficient documentation

## 2011-06-26 NOTE — Progress Notes (Signed)
X=.3% Sotradecol administered with a 27g butterfly.  Patient received a total of 6cc.  Treated her small spiders. Tol well. Will follow prn.  Photos: yes  Compression stockings applied: yes

## 2012-06-10 ENCOUNTER — Encounter (HOSPITAL_COMMUNITY): Payer: Self-pay | Admitting: *Deleted

## 2012-06-10 ENCOUNTER — Emergency Department (HOSPITAL_COMMUNITY): Payer: 59

## 2012-06-10 ENCOUNTER — Emergency Department (HOSPITAL_COMMUNITY)
Admission: EM | Admit: 2012-06-10 | Discharge: 2012-06-11 | Disposition: A | Discharge status: Home / Self Care | Payer: 59 | Attending: Emergency Medicine | Admitting: Emergency Medicine

## 2012-06-10 DIAGNOSIS — K5900 Constipation, unspecified: Secondary | ICD-10-CM | POA: Insufficient documentation

## 2012-06-10 DIAGNOSIS — Z3202 Encounter for pregnancy test, result negative: Secondary | ICD-10-CM | POA: Insufficient documentation

## 2012-06-10 LAB — CBC
HCT: 43.7 % (ref 36.0–46.0)
MCHC: 33.4 g/dL (ref 30.0–36.0)
Platelets: 360 10*3/uL (ref 150–400)
RDW: 13.1 % (ref 11.5–15.5)
WBC: 15.7 10*3/uL — ABNORMAL HIGH (ref 4.0–10.5)

## 2012-06-10 LAB — BASIC METABOLIC PANEL
BUN: 10 mg/dL (ref 6–23)
GFR calc Af Amer: >90 mL/min (ref >90)
GFR calc non Af Amer: >90 mL/min (ref >90)
Potassium: 4.1 mEq/L (ref 3.5–5.1)
Sodium: 137 mEq/L (ref 135–145)

## 2012-06-10 NOTE — ED Notes (Signed)
Pt c/o abd pain, states she has been taking OTC laxatives for problem, poor results, last normal BM over 1 week ago

## 2012-06-11 LAB — URINALYSIS, ROUTINE W REFLEX MICROSCOPIC
Ketones, ur: NEGATIVE mg/dL
Nitrite: NEGATIVE
Protein, ur: NEGATIVE mg/dL
Urobilinogen, UA: 1 mg/dL (ref 0.0–1.0)

## 2012-06-11 LAB — URINE MICROSCOPIC-ADD ON

## 2012-06-11 MED ORDER — SODIUM CHLORIDE 0.9 % IV BOLUS (SEPSIS)
2000.0000 mL | Freq: Once | INTRAVENOUS | Status: AC
  Administered 2012-06-11: 2000 mL via INTRAVENOUS

## 2012-06-11 MED ORDER — POLYETHYLENE GLYCOL 3350 17 G PO PACK: 17.0000 g | PACK | Freq: Every day | ORAL | Status: DC

## 2012-06-11 MED ORDER — DOCUSATE SODIUM 100 MG PO CAPS
100.0000 mg | ORAL_CAPSULE | Freq: Once | ORAL | Status: AC
  Administered 2012-06-11: 100 mg via ORAL
  Filled 2012-06-11: qty 1

## 2012-06-11 MED ORDER — BISACODYL 10 MG RE SUPP
10.0000 mg | Freq: Once | RECTAL | Status: AC
  Administered 2012-06-11: 10 mg via RECTAL
  Filled 2012-06-11: qty 1

## 2012-06-11 MED ORDER — MILK AND MOLASSES ENEMA
Freq: Once | RECTAL | Status: AC
  Administered 2012-06-11: 02:00:00 via RECTAL
  Filled 2012-06-11: qty 250

## 2012-06-11 NOTE — ED Notes (Signed)
After insertion of suppository, pt immediately went to the restroom and pooped out suppository.

## 2012-06-11 NOTE — ED Provider Notes (Signed)
History     CSN: [id number]  Arrival date & time 06/10/12  2256   First MD Initiated Contact with Patient 06/10/12 2309      Chief Complaint  Patient presents with  . Abdominal Pain    (Consider location/radiation/quality/duration/timing/severity/associated sxs/prior treatment) HPI Patient presents to the emergency department with constipation for the last week.  Patient, states, that she's not had a bowel movement that was normal.  For about a week, states, that today she had a small, scant bowel movement.  She states that she took laxatives a few hours prior to arrival that caused pain and discomfort in her abdomen.  Patient denies chest pain, nausea, vomiting, diarrhea, fever, dizziness, weakness, syncope, back pain, dysuria, shortness of breath, or headache.  Patient, states, that she also tried stool softeners without relief.  Patient, states she had a history of constipation following her last pregnancy. Past Medical History  Diagnosis Date  . No pertinent past medical history   . Postpartum care following vaginal delivery 03/08/2011    Past Surgical History  Procedure Laterality Date  . Wisdom teeth      Family History  Problem Relation Age of Onset  . Hypertension Father   . Diabetes Maternal Grandmother   . Diabetes Paternal Grandmother   . Diabetes Paternal Grandfather   . Heart disease Paternal Grandfather     History  Substance Use Topics  . Smoking status: Never Smoker   . Smokeless tobacco: Not on file  . Alcohol Use: No    OB History   Grav Para Term Preterm Abortions TAB SAB Ect Mult Living   4 2 2  0 0 0 0 0 0 2      Review of Systems All other systems negative except as documented in the HPI. All pertinent positives and negatives as reviewed in the HPI. Allergies  Review of patient's allergies indicates no known allergies.  Home Medications   Current Outpatient Rx  Name  Route  Sig  Dispense  Refill  . bisacodyl (DULCOLAX) 5 MG EC tablet  Oral   Take 5 mg by mouth daily as needed for constipation.         . docusate sodium (COLACE) 100 MG capsule   Oral   Take 100 mg by mouth 2 (two) times daily as needed for constipation.         Marland Kitchen OVER THE COUNTER MEDICATION   Oral   Take 1 capsule by mouth 2 (two) times daily. "Body Detox"         . Raspberry Ketones 100 MG CAPS   Oral   Take 1 capsule by mouth 2 (two) times daily.         . Sennosides (EX-LAX PO)   Oral   Take 1 tablet by mouth 2 (two) times daily as needed (constipation).           BP 120/74  Pulse 98  Temp(Src) 98.4 F (36.9 C) (Oral)  SpO2 98%  Physical Exam  Nursing note and vitals reviewed. Constitutional: She is oriented to person, place, and time. She appears well-developed and well-nourished.  HENT:  Head: Normocephalic and atraumatic.  Mouth/Throat: Oropharynx is clear and moist.  Neck: Normal range of motion. Neck supple.  Cardiovascular: Normal rate, regular rhythm and normal heart sounds.  Exam reveals no gallop and no friction rub.   No murmur heard. Pulmonary/Chest: Effort normal and breath sounds normal. No respiratory distress.  Abdominal: Soft. Bowel sounds are normal. She exhibits no distension.  There is no rebound and no guarding.  Patient states, that her abdomen is not tender, but there is discomfort with palpation.  Neurological: She is alert and oriented to person, place, and time.  Skin: Skin is warm and dry. No rash noted. No erythema.    ED Course  Procedures (including critical care time)  Labs Reviewed  URINALYSIS, ROUTINE W REFLEX MICROSCOPIC - Abnormal; Notable for the following:    Color, Urine AMBER (*)    Specific Gravity, Urine 1.039 (*)    Bilirubin Urine SMALL (*)    Leukocytes, UA TRACE (*)    All other components within normal limits  CBC - Abnormal; Notable for the following:    WBC 15.7 (*)    RBC 5.16 (*)    All other components within normal limits  BASIC METABOLIC PANEL - Abnormal;  Notable for the following:    Glucose, Bld 117 (*)    All other components within normal limits  URINE MICROSCOPIC-ADD ON - Abnormal; Notable for the following:    Crystals CA OXALATE CRYSTALS (*)    All other components within normal limits  POCT PREGNANCY, URINE   Dg Abd Acute W/chest  06/11/2012  *RADIOLOGY REPORT*  Clinical Data: Lower abdominal pain.  Constipation.  ACUTE ABDOMEN SERIES (ABDOMEN 2 VIEW & CHEST 1 VIEW)  Comparison: None.  Findings: The heart and lungs appear normal.  No free air in the abdomen.  Stool throughout the nondistended colon.  No dilated small bowel.  Stomach is not distended.  IUD in place.  No osseous abnormality.  IMPRESSION: Extensive stool in the colon.  Otherwise benign-appearing abdomen and chest.   Original Report Authenticated By: Francene Boyers, M.D.     Patient is given suppository, stool softener, fluids, and milk of molasses enema.  Patient has been stable here in the emergency department.    MDM          Carlyle Dolly, PA-C 06/11/12 [phone]

## 2012-06-11 NOTE — ED Provider Notes (Signed)
Medical screening examination/treatment/procedure(s) were performed by non-physician practitioner and as supervising physician I was immediately available for consultation/collaboration.   Hanley Seamen, MD 06/11/12 [phone]

## 2012-06-11 NOTE — ED Notes (Signed)
Patient transported to X-ray 

## 2012-06-11 NOTE — ED Provider Notes (Signed)
Care assumed from [place], PA-C at shift change. Patient receiving an enema to relieve constipation. Enema was given and patient had a normal bowel movement. Pain completely resolved. Abdomen is soft and nontender on my examination. She is stable for discharge. I will discharge her with her lack. States she has stool softeners at home. Advised a high-fiber diet. Return precautions discussed. Patient states understanding of plan and is agreeable.  Trevor Mace, PA-C 06/11/12 [phone]

## 2013-03-23 ENCOUNTER — Ambulatory Visit: Payer: Self-pay | Admitting: Family Medicine

## 2013-05-13 ENCOUNTER — Ambulatory Visit (INDEPENDENT_AMBULATORY_CARE_PROVIDER_SITE_OTHER): Payer: 59 | Admitting: Physician Assistant

## 2013-05-13 ENCOUNTER — Encounter: Payer: Self-pay | Admitting: Physician Assistant

## 2013-05-13 VITALS — BP 112/80 | HR 76 | Temp 98.3°F | Resp 18 | Ht 61.5 in | Wt 131.0 lb

## 2013-05-13 DIAGNOSIS — J029 Acute pharyngitis, unspecified: Secondary | ICD-10-CM

## 2013-05-13 LAB — RAPID STREP SCREEN (MED CTR MEBANE ONLY): Streptococcus, Group A Screen (Direct): NEGATIVE

## 2013-05-13 NOTE — Progress Notes (Signed)
    Patient ID: Madeline Owens MRN: [id number], DOB: [birth year], 28 y.o. Date of Encounter: 05/13/2013, 4:52 PM    Chief Complaint:  Chief Complaint  Patient presents with  . c/o sore throat    states has ulcers in mouth and throat     HPI: 28 y.o. year old white female reports that she's had some aphthous ulcers off and on on different areas of her oral mucosa over the last few months.  Now, for the past couple days, she has seen one of a similar type of ulcers on the right side of her  throat.  That area is painful but now she is noticing that also the left side of her throat is sore as well. Says this has been sore for about 2 days. Has had no fever. Has had no nasal mucus. Has had no chest congestion or cough.     Home Meds: See attached medication section for any medications that were entered at today's visit. The computer does not put those onto this list.The following list is a list of meds entered prior to today's visit.   No current outpatient prescriptions on file prior to visit.   No current facility-administered medications on file prior to visit.    Allergies: No Known Allergies    Review of Systems: See HPI for pertinent ROS. All other ROS negative.    Physical Exam: Blood pressure 112/80, pulse 76, temperature 98.3 F (36.8 C), temperature source Oral, resp. rate 18, height 5' 1.5" (1.562 m), weight 131 lb (59.421 kg)., Body mass index is 24.35 kg/(m^2). General: WNWD WF. Appears in no acute distress. HEENT: Normocephalic, atraumatic, eyes without discharge, sclera non-icteric, nares are without discharge. Bilateral auditory canals clear, TM's are without perforation, pearly grey and translucent with reflective cone of light bilaterally. Oral mucosa has no ulcers present at this time.  Posterior pharynx:  There is a very shallow, small (approx 0.5 cm diameter) ulcer on the right posterior pharynx. There is mild to moderate erythema of bilateral posterior pharynx.  No exudate. No peritonsillar abscess.  Neck: Supple. No thyromegaly. No lymphadenopathy.--She reports that there is NO tenderness when I palpate her neck. Also I feel no enlarged lymph nodes. Lungs: Clear bilaterally to auscultation without wheezes, rales, or rhonchi. Breathing is unlabored. Heart: Regular rhythm. No murmurs, rubs, or gallops. Msk:  Strength and tone normal for age. Extremities/Skin: Warm and dry.  No rashes. Neuro: Alert and oriented X 3. Moves all extremities spontaneously. Gait is normal. CNII-XII grossly in tact. Psych:  Responds to questions appropriately with a normal affect.   Results for orders placed in visit on 05/13/13  RAPID STREP SCREEN      Result Value Ref Range   Source THROAT     Streptococcus, Group A Screen (Direct) NEG  NEGATIVE     ASSESSMENT AND PLAN:  28 y.o. year old female with  1. Viral pharyngitis  Recommended that she use lozenges and spray to help numb the throat and decrease pain. Also can use Tylenol and Motrin to decrease the pain. Follow up if she develops fever or if pain worsens significantly or if pain persist 7 days or more.  2. Sorethroat - Rapid Strep Screen   Signed, [address] [place], [place], [facility] 05/13/2013 4:52 PM

## 2013-05-14 ENCOUNTER — Telehealth: Payer: Self-pay | Admitting: *Deleted

## 2013-05-14 MED ORDER — FIRST-BXN MOUTHWASH MT SUSP: OROMUCOSAL | Status: DC

## 2013-05-14 NOTE — Telephone Encounter (Signed)
Hard to say without actually examining the patient but based upon her symptoms and may best office that yesterday he found that the patient may have apthous ulcers, herpangina from viral pharyngitis, or possibly coxsackie virus.  I recommend the Magic mouthwash 1 teaspoon gargle and swallow every 6 hours as needed for sore throat-4 ounces

## 2013-05-14 NOTE — Telephone Encounter (Signed)
Pt was seen yesterday with symptoms of Sorethroat and fever test came back negative, pt is c/o throat still hurting today and red and feels like there is an ulcer back of throat on right side. Was not given antibiotic since she did not test postive. Can not come in today because she is working. Wants to know if you can call something in for her.

## 2013-05-14 NOTE — Telephone Encounter (Signed)
Patient aware and med sent to pharm 

## 2013-07-20 ENCOUNTER — Ambulatory Visit (INDEPENDENT_AMBULATORY_CARE_PROVIDER_SITE_OTHER): Payer: 59 | Admitting: Family Medicine

## 2013-07-20 DIAGNOSIS — Z111 Encounter for screening for respiratory tuberculosis: Secondary | ICD-10-CM

## 2013-07-20 NOTE — Progress Notes (Signed)
Patient ID: Madeline Owens, female   DOB: 1985-06-08, 28 y.o.   MRN: [id number] PPD TB skin test applied to left forearm without difficulty.  Pt instructed not to rub or scratch area.  Needs to return to office on Thursday for results.  Pt acknowledged understanding.

## 2013-07-22 ENCOUNTER — Ambulatory Visit: Payer: Self-pay

## 2013-07-23 ENCOUNTER — Ambulatory Visit: Payer: 59 | Admitting: *Deleted

## 2013-07-23 DIAGNOSIS — Z111 Encounter for screening for respiratory tuberculosis: Secondary | ICD-10-CM

## 2013-07-23 LAB — TB SKIN TEST
INDURATION: 0 mm
TB Skin Test: NEGATIVE

## 2013-12-27 ENCOUNTER — Encounter: Payer: Self-pay | Admitting: Physician Assistant

## 2014-04-06 ENCOUNTER — Ambulatory Visit: Payer: Self-pay | Admitting: Family Medicine

## 2014-04-06 ENCOUNTER — Ambulatory Visit: Payer: Self-pay | Admitting: Physician Assistant

## 2014-10-24 ENCOUNTER — Ambulatory Visit: Payer: Self-pay | Admitting: Family Medicine

## 2014-10-24 ENCOUNTER — Encounter: Payer: Self-pay | Admitting: Family Medicine

## 2014-10-24 ENCOUNTER — Ambulatory Visit (INDEPENDENT_AMBULATORY_CARE_PROVIDER_SITE_OTHER): Payer: 59 | Admitting: Family Medicine

## 2014-10-24 VITALS — BP 118/70 | HR 76 | Temp 98.0°F | Resp 12 | Ht 61.5 in | Wt 138.0 lb

## 2014-10-24 DIAGNOSIS — L709 Acne, unspecified: Secondary | ICD-10-CM | POA: Diagnosis not present

## 2014-10-24 MED ORDER — ADAPALENE 0.1 % EX GEL: Freq: Every day | CUTANEOUS | Status: DC

## 2014-10-24 NOTE — Progress Notes (Signed)
Patient ID: Madeline Owens, female   DOB: 02-14-1986, 29 y.o.   MRN: [id number]   Subjective:    Patient ID: Madeline Owens, female    DOB: May 23, 1985, 29 y.o.   MRN: [id number]  Patient presents for Bumps to Upper Lip Pt here with two small white bumps above her lip, has had in the past on her forehead, seen by Dermatology 1 year ago told blocked glands, differin gel helped but she is out of this. Denies any pain, itching to lesions, they are not spreading, have been present for a few weeks.     Review Of Systems:  GEN- denies fatigue, fever, weight loss,weakness, recent illness HEENT- denies eye drainage, change in vision, nasal discharge, CVS- denies chest pain, palpitations RESP- denies SOB, cough, wheeze ABD- denies N/V, change in stools, abd pain GU- denies dysuria, hematuria, dribbling, incontinence MSK- denies joint pain, muscle aches, injury Neuro- denies headache, dizziness, syncope, seizure activity       Objective:    BP 118/70 mmHg  Pulse 76  Temp(Src) 98 F (36.7 C) (Oral)  Resp 12  Ht 5' 1.5" (1.562 m)  Wt 138 lb (62.596 kg)  BMI 25.66 kg/m2 GEN- NAD, alert and oriented x3 Skin- two very tiny flesh toned papules with small pustule at center, NT, no erythema - one on philtrum other to left of that       Assessment & Plan:      Problem List Items Addressed This Visit    None    Visit Diagnoses    Acne, unspecified acne type    -  Primary    I think this is a clogged pore, no severe acne, face is very clear, try exofoliating, Differin gel prescribed, can also use Benzyol peroxide, offered derm but has wedding this weekend    Relevant Medications    adapalene (DIFFERIN) 0.1 % gel       Note: This dictation was prepared with Dragon dictation along with smaller phrase technology. Any transcriptional errors that result from this process are unintentional.

## 2014-10-24 NOTE — Patient Instructions (Addendum)
Benzoyl Peroxide 5%- Cream  Differin Gel  F/U as needed

## 2014-10-25 ENCOUNTER — Telehealth: Payer: Self-pay | Admitting: Family Medicine

## 2014-10-25 NOTE — Telephone Encounter (Signed)
Processed PA for Adapalene 0.1% Gel  Case [id number]

## 2014-10-26 NOTE — Telephone Encounter (Signed)
PA approved for Adapalene gel  [id number]

## 2015-01-18 ENCOUNTER — Ambulatory Visit: Payer: 59 | Admitting: Family Medicine

## 2016-03-29 ENCOUNTER — Encounter: Payer: Self-pay | Admitting: Family Medicine

## 2016-03-29 ENCOUNTER — Ambulatory Visit (INDEPENDENT_AMBULATORY_CARE_PROVIDER_SITE_OTHER): Payer: Self-pay | Admitting: Family Medicine

## 2016-03-29 VITALS — BP 120/64 | HR 88 | Temp 98.5°F | Resp 14 | Ht 61.5 in | Wt 147.0 lb

## 2016-03-29 DIAGNOSIS — J111 Influenza due to unidentified influenza virus with other respiratory manifestations: Secondary | ICD-10-CM | POA: Diagnosis not present

## 2016-03-29 DIAGNOSIS — Z20828 Contact with and (suspected) exposure to other viral communicable diseases: Secondary | ICD-10-CM

## 2016-03-29 LAB — INFLUENZA A AND B AG, IMMUNOASSAY
INFLUENZA A ANTIGEN: NOT DETECTED
INFLUENZA B ANTIGEN: NOT DETECTED

## 2016-03-29 MED ORDER — OSELTAMIVIR PHOSPHATE 75 MG PO CAPS: 75.0000 mg | ORAL_CAPSULE | Freq: Two times a day (BID) | ORAL | 0 refills | Status: DC

## 2016-03-29 NOTE — Progress Notes (Signed)
   Subjective:    Patient ID: Madeline Owens, female    DOB: [birth year], 31 y.o.   MRN: [id number]  Patient presents for Positie Exposure to Flu (x5 days- has been exposed to flu- low grade fever and malaise)  Low grade fever yesterday  99.40F, mild body aches, no cough, appetite okay , had looser stool  Took ASA last night for fever   Has had flu shot      Review Of Systems:  GEN- +fatigue+, fever, weight loss,weakness, recent illness HEENT- denies eye drainage, change in vision, nasal discharge, CVS- denies chest pain, palpitations RESP- denies SOB, cough, wheeze ABD- denies N/V, change in stools, abd pain GU- denies dysuria, hematuria, dribbling, incontinence MSK- denies joint pain, +muscle aches, injury Neuro- denies headache, dizziness, syncope, seizure activity       Objective:    BP 120/64 (BP Location: Left Arm, Patient Position: Sitting, Cuff Size: Normal)   Pulse 88   Temp 98.5 F (36.9 C) (Oral)   Resp 14   Ht 5' 1.5" (1.562 m)   Wt 147 lb (66.7 kg)   LMP  (Approximate) Comment: regular  SpO2 99%   BMI 27.33 kg/m  GEN- NAD, alert and oriented x3.non toxic appearing  HEENT- PERRL, EOMI, non injected sclera, pink conjunctiva, MMM, oropharynx clear Neck- Supple, no LAD  CVS- RRR, no murmur RESP-CTAB ABD-NABS,soft,NT,ND EXT- No edema Pulses- Radial - 2+        Assessment & Plan:      Problem List Items Addressed This Visit    None    Visit Diagnoses    Exposure to the flu    -  Primary   Relevant Orders   Influenza A and B Ag, Immunoassay   Influenza       Early symptoms, +exposure may have milder symptoms as she did have flu shot. Will go ahead and start Tamiflu as symptoms within 48hours.    Relevant Medications   oseltamivir (TAMIFLU) 75 MG capsule      Note: This dictation was prepared with Dragon dictation along with smaller phrase technology. Any transcriptional errors that result from this process are unintentional.

## 2016-03-29 NOTE — Patient Instructions (Signed)
Take Tamiflu Fever reducer as needed F/U as needed

## 2016-07-19 ENCOUNTER — Ambulatory Visit: Payer: BLUE CROSS/BLUE SHIELD | Admitting: Family Medicine

## 2016-08-12 ENCOUNTER — Ambulatory Visit (INDEPENDENT_AMBULATORY_CARE_PROVIDER_SITE_OTHER): Payer: 59 | Admitting: Physician Assistant

## 2016-08-12 ENCOUNTER — Encounter: Payer: Self-pay | Admitting: Physician Assistant

## 2016-08-12 VITALS — BP 126/84 | HR 84 | Temp 97.9°F | Resp 16 | Wt 144.8 lb

## 2016-08-12 DIAGNOSIS — W57XXXA Bitten or stung by nonvenomous insect and other nonvenomous arthropods, initial encounter: Secondary | ICD-10-CM | POA: Diagnosis not present

## 2016-08-12 DIAGNOSIS — R5383 Other fatigue: Secondary | ICD-10-CM | POA: Diagnosis not present

## 2016-08-12 DIAGNOSIS — R197 Diarrhea, unspecified: Secondary | ICD-10-CM | POA: Diagnosis not present

## 2016-08-12 DIAGNOSIS — S30861A Insect bite (nonvenomous) of abdominal wall, initial encounter: Secondary | ICD-10-CM

## 2016-08-12 LAB — COMPLETE METABOLIC PANEL WITH GFR
ALBUMIN: 3.9 g/dL (ref 3.6–5.1)
ALK PHOS: 75 U/L (ref 33–115)
ALT: 13 U/L (ref 6–29)
AST: 20 U/L (ref 10–30)
BILIRUBIN TOTAL: 0.2 mg/dL (ref 0.2–1.2)
BUN: 7 mg/dL (ref 7–25)
CALCIUM: 9.1 mg/dL (ref 8.6–10.2)
CO2: 27 mmol/L (ref 20–31)
CREATININE: 0.7 mg/dL (ref 0.50–1.10)
Chloride: 105 mmol/L (ref 98–110)
Glucose, Bld: 75 mg/dL (ref 70–99)
Potassium: 4.1 mmol/L (ref 3.5–5.3)
Sodium: 141 mmol/L (ref 135–146)
TOTAL PROTEIN: 6.9 g/dL (ref 6.1–8.1)

## 2016-08-12 LAB — CBC WITH DIFFERENTIAL/PLATELET
BASOS PCT: 0 %
Basophils Absolute: 0 cells/uL (ref 0–200)
EOS ABS: 70 {cells}/uL (ref 15–500)
Eosinophils Relative: 1 %
HEMATOCRIT: 40.2 % (ref 35.0–45.0)
HEMOGLOBIN: 12.8 g/dL (ref 12.0–15.0)
Lymphocytes Relative: 21 %
Lymphs Abs: 1470 cells/uL (ref 850–3900)
MCH: 27.4 pg (ref 27.0–33.0)
MCHC: 31.8 g/dL — ABNORMAL LOW (ref 32.0–36.0)
MCV: 85.9 fL (ref 80.0–100.0)
MONO ABS: 630 {cells}/uL (ref 200–950)
MPV: 9.2 fL (ref 7.5–12.5)
Monocytes Relative: 9 %
NEUTROS ABS: 4830 {cells}/uL (ref 1500–7800)
Neutrophils Relative %: 69 %
Platelets: 421 10*3/uL — ABNORMAL HIGH (ref 140–400)
RBC: 4.68 MIL/uL (ref 3.80–5.10)
RDW: 13.8 % (ref 11.0–15.0)
WBC: 7 10*3/uL (ref 3.8–10.8)

## 2016-08-12 NOTE — Progress Notes (Signed)
Patient ID: Madeline Owens MRN: [id number], DOB: Jun 27, 1985, 31 y.o. Date of Encounter: 08/12/2016, 12:50 PM    Chief Complaint:  Chief Complaint  Patient presents with  . Tick Removal    on 6/3  . Diarrhea  . Abdominal Pain     HPI: 31 y.o. year old female presents with above.  She states that she found a tick on her abdomen 07/28/16.  She states that she has had no symptoms until this morning. His morning she had some diarrhea abdominal pain and felt really fatigued. Did feel some nausea at that time this morning but has had no vomiting.  Says that someone she knew--had positive Lyme disease--says that that friend didn't even know she had been bitten by a tick. She had diarrhea and fatigue as her symptoms and was found to have Lyme disease. Says that is the only reason she thought about coming in and thought about possibility of having Lyme disease herself.  She has had no rash. She has had no fever. She's had no headache. She has had no myalgias. She's had no fatigue prior to today.  No other concerns to address.     Home Meds:   Outpatient Medications Prior to Visit  Medication Sig Dispense Refill  . oseltamivir (TAMIFLU) 75 MG capsule Take 1 capsule (75 mg total) by mouth 2 (two) times daily. 10 capsule 0   No facility-administered medications prior to visit.     Allergies: No Known Allergies    Review of Systems: See HPI for pertinent ROS. All other ROS negative.    Physical Exam: Blood pressure 126/84, pulse 84, temperature 97.9 F (36.6 C), temperature source Oral, resp. rate 16, weight 144 lb 12.8 oz (65.7 kg), last menstrual period 08/03/2016, SpO2 99 %., Body mass index is 26.92 kg/m. General:  WNWD WF Appears in no acute distress. Neck: Supple. No thyromegaly. No lymphadenopathy. Lungs: Clear bilaterally to auscultation without wheezes, rales, or rhonchi. Breathing is unlabored. Heart: Regular rhythm. No murmurs, rubs, or gallops. Abdomen: Soft,  non-tender, non-distended with normoactive bowel sounds. No hepatomegaly. No rebound/guarding. No obvious abdominal masses. Msk:  Strength and tone normal for age. Extremities/Skin: Warm and dry. She shows me site of tick mark which is located on her upper abdomen in the epigastric region. This is a very tiny pink spot---just at site of tic bite. the site is not raised and is very tiny only about 1 mm. There is no other area of rash. Neuro: Alert and oriented X 3. Moves all extremities spontaneously. Gait is normal. CNII-XII grossly in tact. Psych:  Responds to questions appropriately with a normal affect.     ASSESSMENT AND PLAN:  31 y.o. year old female with  1. Tick bite, initial encounter Obtain labs. Follow up with her once we get lab results. If Lyme and [facility] spotted fever tests are negative, suspect that current symptoms are secondary to a viral gastritis. If her symptoms persist at the time that we get lab results, then will make decision regarding further evaluation at that point. At this point suspect that she does have a viral gastritis and her symptoms will be resolved by the time we call her with the lab results..If this is not the case, then will follow-up accordingly. Discussed all of this with patient and she voices understanding and agrees. I do not think we need to start empiric doxycycline at this time. She is to treat with using clear liquid diet and gradually advance  to bland diet as tolerated. She states that she does not need a note for work. - B. burgdorfi antibodies by [employer] - Rocky mtn spotted fvr abs pnl(IgG+IgM) - CBC with Differential/Platelet - COMPLETE METABOLIC PANEL WITH GFR  2. Diarrhea, unspecified type - B. burgdorfi antibodies by WB - Rocky mtn spotted fvr abs pnl(IgG+IgM) - CBC with Differential/Platelet - COMPLETE METABOLIC PANEL WITH GFR  3. Fatigue, unspecified type - B. burgdorfi antibodies by WB - Rocky mtn spotted fvr abs pnl(IgG+IgM) - CBC  with Differential/Platelet - COMPLETE METABOLIC PANEL WITH GFR   Signed, [address] [place], [place], [facility] 08/12/2016 12:50 PM

## 2016-08-13 LAB — ROCKY MTN SPOTTED FVR ABS PNL(IGG+IGM)
RMSF IgG: NOT DETECTED
RMSF IgM: NOT DETECTED

## 2016-08-15 LAB — LYME ABY, WSTRN BLT IGG & IGM W/BANDS
B burgdorferi IgG Abs (IB): NEGATIVE
B burgdorferi IgM Abs (IB): NEGATIVE
LYME DISEASE 23 KD IGG: NONREACTIVE
LYME DISEASE 28 KD IGG: NONREACTIVE
LYME DISEASE 30 KD IGG: NONREACTIVE
LYME DISEASE 39 KD IGM: NONREACTIVE
LYME DISEASE 41 KD IGG: NONREACTIVE
LYME DISEASE 66 KD IGG: NONREACTIVE
Lyme Disease 18 kD IgG: NONREACTIVE
Lyme Disease 23 kD IgM: NONREACTIVE
Lyme Disease 39 kD IgG: NONREACTIVE
Lyme Disease 41 kD IgM: NONREACTIVE
Lyme Disease 45 kD IgG: NONREACTIVE
Lyme Disease 58 kD IgG: NONREACTIVE
Lyme Disease 93 kD IgG: NONREACTIVE

## 2016-10-14 DIAGNOSIS — M7022 Olecranon bursitis, left elbow: Secondary | ICD-10-CM | POA: Diagnosis not present

## 2016-10-24 DIAGNOSIS — L728 Other follicular cysts of the skin and subcutaneous tissue: Secondary | ICD-10-CM | POA: Diagnosis not present

## 2016-10-24 DIAGNOSIS — D225 Melanocytic nevi of trunk: Secondary | ICD-10-CM | POA: Diagnosis not present

## 2016-11-20 DIAGNOSIS — Z6826 Body mass index (BMI) 26.0-26.9, adult: Secondary | ICD-10-CM | POA: Diagnosis not present

## 2016-11-20 DIAGNOSIS — Z01419 Encounter for gynecological examination (general) (routine) without abnormal findings: Secondary | ICD-10-CM | POA: Diagnosis not present

## 2016-11-27 DIAGNOSIS — Z3201 Encounter for pregnancy test, result positive: Secondary | ICD-10-CM | POA: Diagnosis not present

## 2016-12-02 ENCOUNTER — Encounter: Payer: Self-pay | Admitting: Family Medicine

## 2017-01-13 LAB — OB RESULTS CONSOLE HEPATITIS B SURFACE ANTIGEN: HEP B S AG: NEGATIVE

## 2017-01-13 LAB — OB RESULTS CONSOLE GC/CHLAMYDIA
CHLAMYDIA, DNA PROBE: NEGATIVE
GC PROBE AMP, GENITAL: NEGATIVE

## 2017-01-13 LAB — OB RESULTS CONSOLE HIV ANTIBODY (ROUTINE TESTING): HIV: NONREACTIVE

## 2017-01-13 LAB — OB RESULTS CONSOLE ABO/RH: RH Type: NEGATIVE

## 2017-01-13 LAB — OB RESULTS CONSOLE RUBELLA ANTIBODY, IGM: RUBELLA: IMMUNE

## 2017-01-13 LAB — OB RESULTS CONSOLE ANTIBODY SCREEN: ANTIBODY SCREEN: NEGATIVE

## 2017-01-13 LAB — OB RESULTS CONSOLE RPR: RPR: NONREACTIVE

## 2017-02-25 NOTE — L&D Delivery Note (Signed)
Delivery Note At 8:37 AM a viable female was delivered via Vaginal, Spontaneous ROA Presentation  APGAR: 9 9  weight pending   Placenta status:spontaneously with 3 vessel cord , .  Cord:  with the following complications: none.  Cord pH: not obtained  Anesthesia:  epidural Episiotomy: None Lacerations:  first Suture Repair: 3.0 chromic Est. Blood Loss (mL): 300  Mom to postpartum.  Baby to Couplet care / Skin to Skin.  Madeline Owens L 07/24/2017, 8:55 AM

## 2017-04-23 ENCOUNTER — Encounter: Payer: Self-pay | Admitting: Physician Assistant

## 2017-04-23 ENCOUNTER — Ambulatory Visit: Payer: Self-pay | Admitting: Physician Assistant

## 2017-04-23 ENCOUNTER — Other Ambulatory Visit: Payer: Self-pay

## 2017-04-23 VITALS — BP 124/80 | HR 117 | Temp 97.5°F | Resp 16 | Wt 153.4 lb

## 2017-04-23 DIAGNOSIS — J988 Other specified respiratory disorders: Secondary | ICD-10-CM

## 2017-04-23 DIAGNOSIS — B9789 Other viral agents as the cause of diseases classified elsewhere: Secondary | ICD-10-CM

## 2017-04-23 DIAGNOSIS — R52 Pain, unspecified: Secondary | ICD-10-CM

## 2017-04-23 LAB — INFLUENZA A AND B AG, IMMUNOASSAY
INFLUENZA A ANTIGEN: NOT DETECTED
INFLUENZA B ANTIGEN: NOT DETECTED

## 2017-04-23 NOTE — Progress Notes (Signed)
Patient ID: Madeline Owens MRN: [id number], DOB: 01-10-1986, 32 y.o. Date of Encounter: 04/23/2017, 9:33 AM    Chief Complaint:  Chief Complaint  Patient presents with  . Fever  . Generalized Body Aches  . Cough     HPI: 32 y.o. year old female presents with above.   She reports the symptoms just started yesterday. 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 Also she is [redacted] weeks pregnant so she wanted to just come in and get checked for flu and get checked.  The symptoms just started yesterday.  Yesterday throat felt a little scratchy.  Never had any significant sore throat.  Today her throat is feeling normal and is not even feeling scratchy. Yesterday she felt cold so when she got home she checked her temperature and was 99.6 so she took some Tylenol and laid down and rested. Also was feeling a little achy all over. Has had very little cough and very little nasal congestion.  Says that her daughter was sick last Friday and took her to the doctor but she was negative for flu and was told that she had "a cold."     Home Meds:   Outpatient Medications Prior to Visit  Medication Sig Dispense Refill  . doxylamine, Sleep, (UNISOM) 25 MG tablet Take 25 mg by mouth at bedtime as needed.    . ranitidine (ZANTAC) 150 MG tablet Take 150 mg by mouth 2 (two) times daily.     No facility-administered medications prior to visit.     Allergies: No Known Allergies    Review of Systems: See HPI for pertinent ROS. All other ROS negative.    Physical Exam: Blood pressure 124/80, pulse (!) 117, temperature (!) 97.5 F (36.4 C), temperature source Oral, resp. rate 16, weight 69.6 kg (153 lb 6.4 oz), last menstrual period 08/03/2016, SpO2 99 %., Body mass index is 28.52 kg/m. General:  WNWD WF. Appears in no acute distress. HEENT: Normocephalic,  atraumatic, eyes without discharge, sclera non-icteric, nares are without discharge. Bilateral auditory canals clear, TM's are without perforation, pearly grey and translucent with reflective cone of light bilaterally. Oral cavity moist, posterior pharynx without exudate, erythema, peritonsillar abscess.  Neck: Supple. No thyromegaly. No lymphadenopathy. Lungs: Clear bilaterally to auscultation without wheezes, rales, or rhonchi. Breathing is unlabored. Heart: Regular rhythm. No murmurs, rubs, or gallops. Msk:  Strength and tone normal for age. Extremities/Skin: Warm and dry.  Neuro: Alert and oriented X 3. Moves all extremities spontaneously. Gait is normal. CNII-XII grossly in tact. Psych:  Responds to questions appropriately with a normal affect.     ASSESSMENT AND PLAN:  32 y.o. year old female with  1. Viral respiratory infection Flu test is negative.  Discussed with her that at this point this seems to be a viral illness.  Continue rest and over-the-counter medicines that are approved with pregnancy -- per her OB.  Told her to call and follow-up if develops increased fever or symptoms worsen significantly or persist greater than 7-10 days.  States that at this time she does not need any note to turn into work as she is just part time and is not scheduled to work today.  Follow-up if needed. 2. Generalized body aches - Influenza A and B Ag, Immunoassay   Signed, [address] [place], [place], [facility] 04/23/2017  9:33 AM

## 2017-05-14 DIAGNOSIS — Z23 Encounter for immunization: Secondary | ICD-10-CM | POA: Diagnosis not present

## 2017-05-14 DIAGNOSIS — Z3480 Encounter for supervision of other normal pregnancy, unspecified trimester: Secondary | ICD-10-CM | POA: Diagnosis not present

## 2017-05-14 DIAGNOSIS — Z3A28 28 weeks gestation of pregnancy: Secondary | ICD-10-CM | POA: Diagnosis not present

## 2017-05-14 DIAGNOSIS — O360920 Maternal care for other rhesus isoimmunization, second trimester, not applicable or unspecified: Secondary | ICD-10-CM | POA: Diagnosis not present

## 2017-05-14 DIAGNOSIS — Z3400 Encounter for supervision of normal first pregnancy, unspecified trimester: Secondary | ICD-10-CM | POA: Diagnosis not present

## 2017-06-25 DIAGNOSIS — Z3400 Encounter for supervision of normal first pregnancy, unspecified trimester: Secondary | ICD-10-CM | POA: Diagnosis not present

## 2017-06-25 DIAGNOSIS — Z3A34 34 weeks gestation of pregnancy: Secondary | ICD-10-CM | POA: Diagnosis not present

## 2017-06-25 DIAGNOSIS — Z364 Encounter for antenatal screening for fetal growth retardation: Secondary | ICD-10-CM | POA: Diagnosis not present

## 2017-07-09 DIAGNOSIS — Z3480 Encounter for supervision of other normal pregnancy, unspecified trimester: Secondary | ICD-10-CM | POA: Diagnosis not present

## 2017-07-16 LAB — OB RESULTS CONSOLE GBS: GBS: POSITIVE

## 2017-07-23 ENCOUNTER — Encounter (HOSPITAL_COMMUNITY): Payer: Self-pay | Admitting: *Deleted

## 2017-07-23 ENCOUNTER — Telehealth (HOSPITAL_COMMUNITY): Payer: Self-pay | Admitting: *Deleted

## 2017-07-23 NOTE — Telephone Encounter (Signed)
Preadmission screen  

## 2017-07-24 ENCOUNTER — Inpatient Hospital Stay (HOSPITAL_COMMUNITY)
Admission: AD | Admit: 2017-07-24 | Discharge: 2017-07-25 | DRG: 807 | Disposition: A | Discharge status: Home / Self Care | Payer: 59 | Attending: Obstetrics and Gynecology | Admitting: Obstetrics and Gynecology

## 2017-07-24 ENCOUNTER — Encounter (HOSPITAL_COMMUNITY): Payer: Self-pay

## 2017-07-24 ENCOUNTER — Other Ambulatory Visit: Payer: Self-pay

## 2017-07-24 ENCOUNTER — Inpatient Hospital Stay (HOSPITAL_COMMUNITY): Payer: 59 | Admitting: Anesthesiology

## 2017-07-24 DIAGNOSIS — O99824 Streptococcus B carrier state complicating childbirth: Principal | ICD-10-CM | POA: Diagnosis present

## 2017-07-24 DIAGNOSIS — Z3A38 38 weeks gestation of pregnancy: Secondary | ICD-10-CM | POA: Diagnosis not present

## 2017-07-24 DIAGNOSIS — Z3483 Encounter for supervision of other normal pregnancy, third trimester: Secondary | ICD-10-CM | POA: Diagnosis present

## 2017-07-24 LAB — RPR: RPR: NONREACTIVE

## 2017-07-24 LAB — TYPE AND SCREEN
ABO/RH(D): O NEG
Antibody Screen: NEGATIVE

## 2017-07-24 LAB — CBC
HCT: 34.7 % — ABNORMAL LOW (ref 36.0–46.0)
Hemoglobin: 11.4 g/dL — ABNORMAL LOW (ref 12.0–15.0)
MCH: 27.7 pg (ref 26.0–34.0)
MCHC: 32.9 g/dL (ref 30.0–36.0)
MCV: 84.4 fL (ref 78.0–100.0)
PLATELETS: 220 10*3/uL (ref 150–400)
RBC: 4.11 MIL/uL (ref 3.87–5.11)
RDW: 15.1 % (ref 11.5–15.5)
WBC: 9.3 10*3/uL (ref 4.0–10.5)

## 2017-07-24 LAB — POCT FERN TEST: POCT FERN TEST: POSITIVE

## 2017-07-24 MED ORDER — SIMETHICONE 80 MG PO CHEW: 80.0000 mg | CHEWABLE_TABLET | ORAL | Status: DC | PRN

## 2017-07-24 MED ORDER — PRENATAL MULTIVITAMIN CH
1.0000 | ORAL_TABLET | Freq: Every day | ORAL | Status: DC
  Administered 2017-07-24 – 2017-07-25 (×2): 1 via ORAL
  Filled 2017-07-24 (×2): qty 1

## 2017-07-24 MED ORDER — SENNOSIDES-DOCUSATE SODIUM 8.6-50 MG PO TABS
2.0000 | ORAL_TABLET | ORAL | Status: DC
  Administered 2017-07-24: 2 via ORAL
  Filled 2017-07-24: qty 2

## 2017-07-24 MED ORDER — SODIUM CHLORIDE 0.9 % IV SOLN
2.0000 g | Freq: Once | INTRAVENOUS | Status: AC
  Administered 2017-07-24: 2 g via INTRAVENOUS
  Filled 2017-07-24: qty 2

## 2017-07-24 MED ORDER — LACTATED RINGERS IV SOLN: 500.0000 mL | INTRAVENOUS | Status: DC | PRN

## 2017-07-24 MED ORDER — BENZOCAINE-MENTHOL 20-0.5 % EX AERO
1.0000 "application " | INHALATION_SPRAY | CUTANEOUS | Status: DC | PRN
  Administered 2017-07-24: 1 via TOPICAL
  Filled 2017-07-24: qty 56

## 2017-07-24 MED ORDER — ACETAMINOPHEN 325 MG PO TABS: 650.0000 mg | ORAL_TABLET | ORAL | Status: DC | PRN

## 2017-07-24 MED ORDER — FLEET ENEMA 7-19 GM/118ML RE ENEM: 1.0000 | ENEMA | Freq: Every day | RECTAL | Status: DC | PRN

## 2017-07-24 MED ORDER — ONDANSETRON HCL 4 MG/2ML IJ SOLN: 4.0000 mg | INTRAMUSCULAR | Status: DC | PRN

## 2017-07-24 MED ORDER — LACTATED RINGERS IV SOLN
INTRAVENOUS | Status: DC
  Administered 2017-07-24: 05:00:00 via INTRAVENOUS

## 2017-07-24 MED ORDER — OXYTOCIN BOLUS FROM INFUSION
500.0000 mL | Freq: Once | INTRAVENOUS | Status: AC
  Administered 2017-07-24: 500 mL via INTRAVENOUS

## 2017-07-24 MED ORDER — LIDOCAINE HCL (PF) 1 % IJ SOLN
INTRAMUSCULAR | Status: DC | PRN
  Administered 2017-07-24 (×2): 5 mL via EPIDURAL

## 2017-07-24 MED ORDER — ONDANSETRON HCL 4 MG/2ML IJ SOLN: 4.0000 mg | Freq: Four times a day (QID) | INTRAMUSCULAR | Status: DC | PRN

## 2017-07-24 MED ORDER — DIBUCAINE 1 % RE OINT
1.0000 "application " | TOPICAL_OINTMENT | RECTAL | Status: DC | PRN
  Administered 2017-07-24: 1 via RECTAL
  Filled 2017-07-24: qty 28

## 2017-07-24 MED ORDER — ACETAMINOPHEN 325 MG PO TABS
650.0000 mg | ORAL_TABLET | ORAL | Status: DC | PRN
Start: 2017-07-24 — End: 2017-07-26

## 2017-07-24 MED ORDER — COCONUT OIL OIL: 1.0000 "application " | TOPICAL_OIL | Status: DC | PRN

## 2017-07-24 MED ORDER — DIPHENHYDRAMINE HCL 25 MG PO CAPS: 25.0000 mg | ORAL_CAPSULE | Freq: Four times a day (QID) | ORAL | Status: DC | PRN

## 2017-07-24 MED ORDER — TETANUS-DIPHTH-ACELL PERTUSSIS 5-2.5-18.5 LF-MCG/0.5 IM SUSP: 0.5000 mL | Freq: Once | INTRAMUSCULAR | Status: DC

## 2017-07-24 MED ORDER — SOD CITRATE-CITRIC ACID 500-334 MG/5ML PO SOLN: 30.0000 mL | ORAL | Status: DC | PRN

## 2017-07-24 MED ORDER — BISACODYL 10 MG RE SUPP: 10.0000 mg | Freq: Every day | RECTAL | Status: DC | PRN

## 2017-07-24 MED ORDER — IBUPROFEN 600 MG PO TABS
600.0000 mg | ORAL_TABLET | Freq: Four times a day (QID) | ORAL | Status: DC
  Administered 2017-07-24 – 2017-07-25 (×6): 600 mg via ORAL
  Filled 2017-07-24 (×6): qty 1

## 2017-07-24 MED ORDER — LACTATED RINGERS IV SOLN: 500.0000 mL | Freq: Once | INTRAVENOUS | Status: DC

## 2017-07-24 MED ORDER — EPHEDRINE 5 MG/ML INJ
10.0000 mg | INTRAVENOUS | Status: DC | PRN
  Filled 2017-07-24: qty 2

## 2017-07-24 MED ORDER — WITCH HAZEL-GLYCERIN EX PADS
1.0000 "application " | MEDICATED_PAD | CUTANEOUS | Status: DC | PRN
  Administered 2017-07-24: 1 via TOPICAL

## 2017-07-24 MED ORDER — PHENYLEPHRINE 40 MCG/ML (10ML) SYRINGE FOR IV PUSH (FOR BLOOD PRESSURE SUPPORT)
80.0000 ug | PREFILLED_SYRINGE | INTRAVENOUS | Status: DC | PRN
  Filled 2017-07-24: qty 10
  Filled 2017-07-24: qty 5

## 2017-07-24 MED ORDER — FENTANYL 2.5 MCG/ML BUPIVACAINE 1/10 % EPIDURAL INFUSION (WH - ANES)
14.0000 mL/h | INTRAMUSCULAR | Status: DC | PRN
  Administered 2017-07-24: 14 mL/h via EPIDURAL
  Filled 2017-07-24: qty 100

## 2017-07-24 MED ORDER — MEDROXYPROGESTERONE ACETATE 150 MG/ML IM SUSP: 150.0000 mg | INTRAMUSCULAR | Status: DC | PRN

## 2017-07-24 MED ORDER — DIPHENHYDRAMINE HCL 50 MG/ML IJ SOLN: 12.5000 mg | INTRAMUSCULAR | Status: DC | PRN

## 2017-07-24 MED ORDER — PHENYLEPHRINE 40 MCG/ML (10ML) SYRINGE FOR IV PUSH (FOR BLOOD PRESSURE SUPPORT)
80.0000 ug | PREFILLED_SYRINGE | INTRAVENOUS | Status: DC | PRN
  Filled 2017-07-24: qty 5

## 2017-07-24 MED ORDER — MEASLES, MUMPS & RUBELLA VAC ~~LOC~~ INJ
0.5000 mL | INJECTION | Freq: Once | SUBCUTANEOUS | Status: DC
  Filled 2017-07-24: qty 0.5

## 2017-07-24 MED ORDER — OXYCODONE-ACETAMINOPHEN 5-325 MG PO TABS: 1.0000 | ORAL_TABLET | ORAL | Status: DC | PRN

## 2017-07-24 MED ORDER — ONDANSETRON HCL 4 MG PO TABS
4.0000 mg | ORAL_TABLET | ORAL | Status: DC | PRN
Start: 2017-07-24 — End: 2017-07-26

## 2017-07-24 MED ORDER — OXYTOCIN 40 UNITS IN LACTATED RINGERS INFUSION - SIMPLE MED
2.5000 [IU]/h | INTRAVENOUS | Status: DC
  Filled 2017-07-24: qty 1000

## 2017-07-24 MED ORDER — LIDOCAINE HCL (PF) 1 % IJ SOLN
30.0000 mL | INTRAMUSCULAR | Status: DC | PRN
  Filled 2017-07-24: qty 30

## 2017-07-24 MED ORDER — OXYCODONE-ACETAMINOPHEN 5-325 MG PO TABS: 2.0000 | ORAL_TABLET | ORAL | Status: DC | PRN

## 2017-07-24 MED ORDER — ZOLPIDEM TARTRATE 5 MG PO TABS: 5.0000 mg | ORAL_TABLET | Freq: Every evening | ORAL | Status: DC | PRN

## 2017-07-24 NOTE — MAU Note (Signed)
Pt states water broke at 1130p clear fluid. Pt report some cramping but no contractions. Reports good fetal movement. Cervix was 3.5cm earlier.

## 2017-07-24 NOTE — Lactation Note (Signed)
This note was copied from a baby's chart. Lactation Consultation Note  Patient Name: Boy Shalimar Mcclain Today's Date: 07/24/2017 Reason for consult: Initial assessment;Early term 41-38.6wks Mom called out wondering if she needs to give a bottle.  Baby is 7 hours old.  She has been told baby has a tongue tie and may have difficulty feeding.  Mom just pumped with symphony pump but no milk obtained.  Room full of visitors.  Offered latch assist but mom wanting to wait for visitors to leave.  Instructed to call for cues and call for assist when ready for help.  Maternal Data Does the patient have breastfeeding experience prior to this delivery?: No  Feeding    LATCH Score                   Interventions    Lactation Tools Discussed/Used     Consult Status Consult Status: Follow-up Date: 07/25/17    Ave Filter 07/24/2017, 4:31 PM

## 2017-07-24 NOTE — Anesthesia Postprocedure Evaluation (Signed)
Anesthesia Post Note  Patient: Madeline Owens  Procedure(s) Performed: AN AD HOC LABOR EPIDURAL     Patient location during evaluation: Mother Baby Anesthesia Type: Epidural Level of consciousness: awake and alert Pain management: pain level controlled Vital Signs Assessment: post-procedure vital signs reviewed and stable Respiratory status: spontaneous breathing, nonlabored ventilation and respiratory function stable Cardiovascular status: stable Postop Assessment: no headache, no backache, epidural receding, able to ambulate, adequate PO intake, no apparent nausea or vomiting and patient able to bend at knees Anesthetic complications: no    Last Vitals:  Vitals:   07/24/17 0946 07/24/17 1015  BP: 113/67 115/68  Pulse: 88 79  Resp:    Temp:  (!) 36.4 C  SpO2:      Last Pain:  Vitals:   07/24/17 1130  TempSrc:   PainSc: 0-No pain   Pain Goal:                 [employer]

## 2017-07-24 NOTE — Anesthesia Preprocedure Evaluation (Signed)

## 2017-07-24 NOTE — Anesthesia Pain Management Evaluation Note (Signed)
  CRNA Pain Management Visit Note  Patient: Madeline Owens, 32 y.o., female  "Hello I am a member of the anesthesia team at [facility]. We have an anesthesia team available at all times to provide care throughout the hospital, including epidural management and anesthesia for C-section. I don't know your plan for the delivery whether it a natural birth, water birth, IV sedation, nitrous supplementation, doula or epidural, but we want to meet your pain goals."   1.Was your pain managed to your expectations on prior hospitalizations?   Yes   2.What is your expectation for pain management during this hospitalization?     Epidural  3.How can we help you reach that goal? unsure  Record the patient's initial score and the patient's pain goal.   Pain: 2  Pain Goal: 6 [facility] wants you to be able to say your pain was always managed very well.  Casimer Lanius 07/24/2017

## 2017-07-24 NOTE — Anesthesia Postprocedure Evaluation (Signed)
Anesthesia Post Note  Patient: Madeline Owens  Procedure(s) Performed: AN AD HOC LABOR EPIDURAL     Patient location during evaluation: Mother Baby Anesthesia Type: Epidural Level of consciousness: awake and alert Pain management: pain level controlled Vital Signs Assessment: post-procedure vital signs reviewed and stable Respiratory status: spontaneous breathing, nonlabored ventilation and respiratory function stable Cardiovascular status: stable Postop Assessment: no headache, no backache and epidural receding Anesthetic complications: no    Last Vitals:  Vitals:   07/24/17 0946 07/24/17 1015  BP: 113/67 115/68  Pulse: 88 79  Resp:    Temp:  (!) 36.4 C  SpO2:      Last Pain:  Vitals:   07/24/17 1030  TempSrc:   PainSc: 0-No pain   Pain Goal:                 Zamire Whitehurst

## 2017-07-24 NOTE — H&P (Signed)
Madeline Owens is a 32 y.o.G 3 P 2 at 47 w 2 days presented with SROM and contractions Status post epidural GBBS + has received antibiotics OB History    Gravida  3   Para  2   Term  2   Preterm  0   AB  0   Living  2     SAB  0   TAB  0   Ectopic  0   Multiple  0   Live Births  2          Past Medical History:  Diagnosis Date  . Anxiety    first pregnancy  . No pertinent past medical history   . Postpartum care following vaginal delivery 03/08/2011  . Vaginal Pap smear, abnormal    Past Surgical History:  Procedure Laterality Date  . wisdom teeth     Family History: family history includes Diabetes in her father, maternal grandmother, paternal grandfather, and paternal grandmother; Heart attack in her paternal grandfather and paternal uncle; Heart disease in her father and paternal grandfather; Hypertension in her father, paternal grandfather, and paternal grandmother; Kidney Stones in her sister; Melanoma in her maternal aunt; Stroke in her maternal grandmother; Thyroid disease in her mother. Social History:  reports that she has never smoked. She has never used smokeless tobacco. She reports that she does not drink alcohol or use drugs.     Maternal Diabetes: No Genetic Screening: Normal Maternal Ultrasounds/Referrals: Normal Fetal Ultrasounds or other Referrals:  None Maternal Substance Abuse:  No Significant Maternal Medications:  None Significant Maternal Lab Results:  None Other Comments:  None  Review of Systems  All other systems reviewed and are negative.  Maternal Medical History:  Reason for admission: Rupture of membranes and contractions.     Dilation: 8 Effacement (%): 100 Station: 0 Exam by:: Primitivo Gauze RN  Blood pressure 115/73, pulse (!) 105, temperature 98.9 F (37.2 C), temperature source Oral, resp. rate 18, height 5\' 2"  (1.575 m), weight 72.6 kg (160 lb), last menstrual period 10/29/2016, SpO2 98 %. Maternal Exam:   Abdomen: Fetal presentation: vertex     Fetal Exam Fetal State Assessment: Category I - tracings are normal.     Physical Exam  Nursing note and vitals reviewed. Constitutional: She appears well-developed and well-nourished.  HENT:  Head: Normocephalic.  Eyes: Pupils are equal, round, and reactive to light.  Neck: Normal range of motion.  Cardiovascular: Normal rate and regular rhythm.  Respiratory: Effort normal.  GI: Soft.    Prenatal labs: ABO, Rh: --/--/O NEG (05/30 0055) Antibody: NEG (05/30 0055) Rubella: Immune (11/19 0000) RPR: Nonreactive (11/19 0000)  HBsAg: Negative (11/19 0000)  HIV: Non-reactive (11/19 0000)  GBS: Positive (05/22 0000)   Assessment/Plan: IUP at 57 w 2 days SROM Anticipate NSVD   Madeline Owens L 07/24/2017, 7:42 AM

## 2017-07-24 NOTE — Anesthesia Procedure Notes (Signed)
Epidural Patient location during procedure: OB  Staffing Anesthesiologist: Pati Thinnes, MD Performed: anesthesiologist   Preanesthetic Checklist Completed: patient identified, site marked, surgical consent, pre-op evaluation, timeout performed, IV checked, risks and benefits discussed and monitors and equipment checked  Epidural Patient position: sitting Prep: DuraPrep Patient monitoring: heart rate, continuous pulse ox and blood pressure Approach: right paramedian Location: L4-L5 Injection technique: LOR saline  Needle:  Needle type: Tuohy  Needle gauge: 17 G Needle length: 9 cm and 9 Needle insertion depth: 6 cm Catheter type: closed end flexible Catheter size: 20 Guage Catheter at skin depth: 10 cm Test dose: negative  Assessment Events: blood not aspirated, injection not painful, no injection resistance, negative IV test and no paresthesia  Additional Notes Patient identified. Risks/Benefits/Options discussed with patient including but not limited to bleeding, infection, nerve damage, paralysis, failed block, incomplete pain control, headache, blood pressure changes, nausea, vomiting, reactions to medication both or allergic, itching and postpartum back pain. Confirmed with bedside nurse the patient's most recent platelet count. Confirmed with patient that they are not currently taking any anticoagulation, have any bleeding history or any family history of bleeding disorders. Patient expressed understanding and wished to proceed. All questions were answered. Sterile technique was used throughout the entire procedure. Please see nursing notes for vital signs. Test dose was given through epidural needle and negative prior to continuing to dose epidural or start infusion. Warning signs of high block given to the patient including shortness of breath, tingling/numbness in hands, complete motor block, or any concerning symptoms with instructions to call for help. Patient was given  instructions on fall risk and not to get out of bed. All questions and concerns addressed with instructions to call with any issues.     

## 2017-07-25 LAB — CBC
HCT: 33.1 % — ABNORMAL LOW (ref 36.0–46.0)
Hemoglobin: 10.6 g/dL — ABNORMAL LOW (ref 12.0–15.0)
MCH: 27.7 pg (ref 26.0–34.0)
MCHC: 32 g/dL (ref 30.0–36.0)
MCV: 86.6 fL (ref 78.0–100.0)
PLATELETS: 185 10*3/uL (ref 150–400)
RBC: 3.82 MIL/uL — ABNORMAL LOW (ref 3.87–5.11)
RDW: 15.2 % (ref 11.5–15.5)
WBC: 8.5 10*3/uL (ref 4.0–10.5)

## 2017-07-25 LAB — BIRTH TISSUE RECOVERY COLLECTION (PLACENTA DONATION)

## 2017-07-25 MED ORDER — RHO D IMMUNE GLOBULIN 1500 UNIT/2ML IJ SOSY
300.0000 ug | PREFILLED_SYRINGE | Freq: Once | INTRAMUSCULAR | Status: AC
  Administered 2017-07-25: 300 ug via INTRAVENOUS
  Filled 2017-07-25: qty 2

## 2017-07-25 NOTE — Progress Notes (Signed)
Madeline Owens was referred for history of depression/anxiety. * Referral screened out by Clinical Social Worker because none of the following criteria appear to apply: ~ History of anxiety/depression during this pregnancy, or of post-partum depression. ~ Diagnosis of anxiety and/or depression within last 3 years OR * Madeline Owens's symptoms currently being treated with medication and/or therapy. Please contact the Clinical Social Worker if needs arise, by [facility] request, or if Madeline Owens scores greater than 9/yes to question 10 on Edinburgh Postpartum Depression Screen.  Chart notes Anxiety with first pregnancy in 2012, with no mental health concerns noted with subsequent pregnancy.

## 2017-07-25 NOTE — Lactation Note (Signed)
This note was copied from a baby's chart. Lactation Consultation Note  Patient Name: Madeline Owens [id number] Date: 07/25/2017 Reason for consult: Follow-up assessment;Mother's request  31 hours old early term female who is being partially BF and formula fed by his mother. Baby had a frenotomy at 1:06 pm and also a circumcision this morning.  LC went to the room twice, baby was asleep the first time and the second one mom voiced her RN had already helped her latch baby on and she got a good latch and felt confident BF but no LATCH score to be found in Flowsheets.  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 Pump given, discharge instructions were also reviewed. Mom has an appt with baby's pediatrician tomorrow. She also has [place] OP phone number and she'll contact PRN if any questions or concerns arise.  Maternal Data    Feeding Feeding Type: Breast Fed Length of feed: 10 min  LATCH Score Latch: Repeated attempts needed to sustain latch, nipple held in mouth throughout feeding, stimulation needed to elicit sucking reflex.  Audible Swallowing: A few with stimulation  Type of Nipple: Everted at rest and after stimulation  Comfort (Breast/Nipple): Soft / non-tender  Hold (Positioning): Assistance needed to correctly position infant at breast and maintain latch.  LATCH Score: 7  Interventions Interventions: Breast feeding basics reviewed;DEBP  Lactation Tools Discussed/Used     Consult Status Consult Status: Complete Date: 07/25/17 Follow-up type: Call as needed    Marcha Licklider Francene Boyers 07/25/2017, 7:11 PM

## 2017-07-25 NOTE — Discharge Summary (Signed)
Obstetric Discharge Summary Reason for Admission: onset of labor Prenatal Procedures: none Intrapartum Procedures: spontaneous vaginal delivery Postpartum Procedures: none Complications-Operative and Postpartum: none Hemoglobin  Date Value Ref Range Status  07/25/2017 10.6 (L) 12.0 - 15.0 g/dL Final   HCT  Date Value Ref Range Status  07/25/2017 33.1 (L) 36.0 - 46.0 % Final    Physical Exam:  General: alert, cooperative, appears stated age and no distress Lochia: appropriate Uterine Fundus: firm Incision: healing well DVT Evaluation: No evidence of DVT seen on physical exam.  Discharge Diagnoses: Term Pregnancy-delivered  Discharge Information: Date: 07/25/2017 Activity: pelvic rest Diet: routine Medications: None Condition: stable Instructions: refer to practice specific booklet Discharge to: home   Newborn Data: Live born female  Birth Weight: 7 lb 1.8 oz (3225 g) APGAR: 9, 9  Newborn Delivery   Birth date/time:  07/24/2017 08:37:00 Delivery type:  Vaginal, Spontaneous     Home with mother.  Trip Cavanagh C 07/25/2017, 9:23 AM

## 2017-07-25 NOTE — Lactation Note (Signed)
This note was copied from a baby's chart. Lactation Consultation Note Baby 41 hrs old. Mom requesting formula, has requested formula a couple of times through out the day. LEAD has been discussed.  Mom has NS d/t flat nipples. Baby has anterior tongue tie per Dr. Denton Ar mom. Mom stated Dr. Corinna Capra wants mom to have tongue clipped here at hospital before d/c home.  Baby has uncoordinated suck on gloved finger. Baby chomps then w/tongue massage will suck. Has labial frenulum as well. Palate normal.  Mom has large pendulous breast w/flat nipples at the bottom end of breast. Mom had been given NS, states it stays on well. Hand expression demonstrated colostrum. Mom has DEBP at bedside. Encouraged to pump then hand express and give baby colostrum for xtra supplementation after BF.  Mom has 37 and 46 yr old that she didn't BF. Mom stated FOB pushing mom to give formula so the baby would be hungry, stated she was that way with her other children as well. Mom stated maybe if she pumped and put it in a bottle they could see how much he was getting. LC encouraged mom to pump if that is what she wanted to do for feeding, and definitely if she wanted to supplement after BF. Informed mom that may not get any colostrum the first couple of days pumping d/t consistency of colostrum, but encouraged to pump for stimulation and hand express afterwards.   Mom wants to have tongue clipped and baby circumcised 07/25/17 and was hoping to go home in late afternoon. Suggested mom stay another day for feeding assistance.  LC gave baby Similac 19 cal. 76ml. Baby had uncoordinated suck, chewed and gagged at times.  Mom stated she felt baby was hungry because he was so fussy and sucking on his hands a lot. Encouraged to occasionally massage breast while feeding. Offered assistance. Mom stated she has fed a lot and feels like he needs formula.  Newborn feeding habits and behavior discussed. Cluster feeding, STS, I&O, supply and demand  reviewed.  Patient Name: Madeline Owens [id number] Date: 07/25/2017 Reason for consult: Mother's request   Maternal Data    Feeding Feeding Type: Formula Nipple Type: Slow - flow  LATCH Score       Type of Nipple: Flat  Comfort (Breast/Nipple): Soft / non-tender        Interventions Interventions: Breast feeding basics reviewed;Breast compression;Hand express;Shells;DEBP  Lactation Tools Discussed/Used Tools: Nipple Shields Nipple shield size: 20   Consult Status Consult Status: Follow-up Date: 07/25/17 Follow-up type: In-patient    Margit Batte, Elta Guadeloupe 07/25/2017, 1:03 AM

## 2017-07-26 LAB — RH IG WORKUP (INCLUDES ABO/RH)
ABO/RH(D): O NEG
Fetal Screen: NEGATIVE
Gestational Age(Wks): 38.2
UNIT DIVISION: 0

## 2017-07-30 ENCOUNTER — Inpatient Hospital Stay (HOSPITAL_COMMUNITY): Admission: RE | Admit: 2017-07-30 | Payer: 59 | Source: Ambulatory Visit

## 2017-08-04 DIAGNOSIS — Z1389 Encounter for screening for other disorder: Secondary | ICD-10-CM | POA: Diagnosis not present

## 2017-08-11 DIAGNOSIS — O99345 Other mental disorders complicating the puerperium: Secondary | ICD-10-CM | POA: Diagnosis not present

## 2017-09-02 DIAGNOSIS — Z1389 Encounter for screening for other disorder: Secondary | ICD-10-CM | POA: Diagnosis not present

## 2017-09-08 DIAGNOSIS — Z113 Encounter for screening for infections with a predominantly sexual mode of transmission: Secondary | ICD-10-CM | POA: Diagnosis not present

## 2017-09-08 DIAGNOSIS — Z3202 Encounter for pregnancy test, result negative: Secondary | ICD-10-CM | POA: Diagnosis not present

## 2017-09-08 DIAGNOSIS — Z30430 Encounter for insertion of intrauterine contraceptive device: Secondary | ICD-10-CM | POA: Diagnosis not present

## 2017-10-17 MED FILL — SERTRALINE HCL 50 MG TABLET: 50 | 30 days supply | Qty: 30 | Fill #0

## 2017-10-29 DIAGNOSIS — Z3202 Encounter for pregnancy test, result negative: Secondary | ICD-10-CM | POA: Diagnosis not present

## 2017-10-29 DIAGNOSIS — Z30431 Encounter for routine checking of intrauterine contraceptive device: Secondary | ICD-10-CM | POA: Diagnosis not present

## 2017-11-05 ENCOUNTER — Ambulatory Visit: Payer: Self-pay | Admitting: Family Medicine

## 2017-11-13 MED FILL — SERTRALINE HCL 50 MG TABLET: 50 | 30 days supply | Qty: 30 | Fill #1

## 2018-03-09 DIAGNOSIS — R509 Fever, unspecified: Secondary | ICD-10-CM | POA: Diagnosis not present

## 2018-03-09 DIAGNOSIS — J111 Influenza due to unidentified influenza virus with other respiratory manifestations: Secondary | ICD-10-CM | POA: Diagnosis not present

## 2018-04-03 ENCOUNTER — Encounter: Payer: Self-pay | Admitting: Family Medicine

## 2018-04-03 ENCOUNTER — Ambulatory Visit: Payer: BLUE CROSS/BLUE SHIELD | Admitting: Family Medicine

## 2018-04-03 VITALS — BP 120/72 | HR 98 | Temp 98.5°F | Resp 14 | Ht 61.5 in | Wt 152.0 lb

## 2018-04-03 DIAGNOSIS — R1084 Generalized abdominal pain: Secondary | ICD-10-CM

## 2018-04-03 DIAGNOSIS — K5900 Constipation, unspecified: Secondary | ICD-10-CM | POA: Diagnosis not present

## 2018-04-03 MED ORDER — LINACLOTIDE 72 MCG PO CAPS: 72.0000 ug | ORAL_CAPSULE | Freq: Every day | ORAL | 0 refills | Status: DC | PRN

## 2018-04-03 NOTE — Progress Notes (Signed)
Patient ID: Madeline Owens, female    DOB: 1985/09/25, 33 y.o.   MRN: [id number]  PCP: Susy Frizzle, MD  Chief Complaint  Patient presents with  . Abdominal Pain    Patient in with c/o mid abdomen pain. Has hx of hernia. Onset last night     Subjective:   Madeline Owens is a 33 y.o. female, presents to clinic with CC of constipation and abdominal pain. Abdominal Pain  This is a new problem. The current episode started in the past 7 days. The onset quality is gradual. The problem occurs constantly. The problem has been gradually worsening. The pain is located in the generalized abdominal region. The pain is at a severity of 1/10. The quality of the pain is a sensation of fullness and aching. The abdominal pain does not radiate. Associated symptoms include constipation. Pertinent negatives include no anorexia, arthralgias, belching, diarrhea, dysuria, fever, flatus, frequency, headaches, hematochezia, hematuria, melena, myalgias, nausea, vomiting or weight loss. Nothing aggravates the pain. The pain is relieved by nothing. Treatments tried: dolcusate. There is no history of abdominal surgery, colon cancer, Crohn's disease, gallstones, GERD, irritable bowel syndrome, pancreatitis, PUD or ulcerative colitis.  She also reports that she has been told she has a hernia she is not sure where.  She does feel bloated and when she laid down to allow me to examine her abdomen she says that she thinks she was told she had a hernia around her bellybutton.  Not had anything bulge she has had no masses there she just generally feels bloated. Pain is not worse with any positional changes pain is not worse with eating and she does feel hungry.  Pain is only exacerbated if she presses on her stomach or her son jumps on it.        Patient Active Problem List   Diagnosis Date Noted  . Normal labor 07/24/2017  . NSVD (normal spontaneous vaginal delivery) 07/24/2017  . Nevus, non-neoplastic  06/26/2011  . Postpartum care following vaginal delivery 03/08/2011     Prior to Admission medications   Medication Sig Start Date End Date Taking? Authorizing Provider  docusate sodium (COLACE) 100 MG capsule Take 100 mg by mouth 2 (two) times daily.   Yes [provider]     No Known Allergies   Family History  Problem Relation Age of Onset  . Hypertension Father   . Heart disease Father   . Diabetes Father   . Diabetes Maternal Grandmother   . Stroke Maternal Grandmother   . Diabetes Paternal Grandmother   . Hypertension Paternal Grandmother   . Diabetes Paternal Grandfather   . Heart disease Paternal Grandfather   . Hypertension Paternal Grandfather   . Heart attack Paternal Grandfather   . Thyroid disease Mother   . Kidney Stones Sister   . Melanoma Maternal Aunt   . Heart attack Paternal Uncle      Social History   Socioeconomic History  . Marital status: Married    Spouse name: Not on file  . Number of children: Not on file  . Years of education: Not on file  . Highest education level: Not on file  Occupational History  . Not on file  Social Needs  . Financial resource strain: Not on file  . Food insecurity:    Worry: Not on file    Inability: Not on file  . Transportation needs:    Medical: Not on file    Non-medical: Not on  file  Tobacco Use  . Smoking status: Never Smoker  . Smokeless tobacco: Never Used  Substance and Sexual Activity  . Alcohol use: No  . Drug use: No  . Sexual activity: Not Currently  Lifestyle  . Physical activity:    Days per week: Not on file    Minutes per session: Not on file  . Stress: Not on file  Relationships  . Social connections:    Talks on phone: Not on file    Gets together: Not on file    Attends religious service: Not on file    Active member of club or organization: Not on file    Attends meetings of clubs or organizations: Not on file    Relationship status: Not on file  . Intimate partner  violence:    Fear of current or ex partner: Not on file    Emotionally abused: Not on file    Physically abused: Not on file    Forced sexual activity: Not on file  Other Topics Concern  . Not on file  Social History Narrative  . Not on file     Review of Systems  Constitutional: Negative.  Negative for activity change, appetite change, chills, diaphoresis, fatigue, fever and weight loss.  HENT: Negative.   Eyes: Negative.   Respiratory: Negative.   Cardiovascular: Negative.  Negative for chest pain, palpitations and leg swelling.  Gastrointestinal: Positive for abdominal distention, abdominal pain and constipation. Negative for anal bleeding, anorexia, blood in stool, diarrhea, flatus, hematochezia, melena, nausea, rectal pain and vomiting.  Endocrine: Negative.   Genitourinary: Negative.  Negative for dysuria, frequency and hematuria.  Musculoskeletal: Negative.  Negative for arthralgias, back pain and myalgias.  Skin: Negative.   Allergic/Immunologic: Negative.   Neurological: Negative.  Negative for headaches.  Hematological: Negative.   All other systems reviewed and are negative.      Objective:    Vitals:   04/03/18 1509  BP: 120/72  Pulse: 98  Resp: 14  Temp: 98.5 F (36.9 C)  TempSrc: Oral  SpO2: 100%  Weight: 152 lb (68.9 kg)  Height: 5' 1.5" (1.562 m)      Physical Exam Vitals signs and nursing note reviewed.  Constitutional:      General: She is not in acute distress.    Appearance: She is well-developed. She is not ill-appearing or toxic-appearing.  HENT:     Head: Normocephalic and atraumatic.     Nose: Nose normal.  Eyes:     General: No scleral icterus.       Right eye: No discharge.        Left eye: No discharge.     Conjunctiva/sclera: Conjunctivae normal.  Neck:     Trachea: No tracheal deviation.  Cardiovascular:     Rate and Rhythm: Normal rate and regular rhythm.     Heart sounds: Normal heart sounds.  Pulmonary:     Effort:  Pulmonary effort is normal. No respiratory distress.     Breath sounds: No stridor.  Abdominal:     General: Bowel sounds are normal. There is no distension or abdominal bruit.     Palpations: Abdomen is soft. There is no hepatomegaly, splenomegaly, mass or pulsatile mass.     Tenderness: There is no abdominal tenderness. There is no right CVA tenderness, left CVA tenderness, guarding or rebound. Negative signs include Murphy's sign and McBurney's sign.     Hernia: No hernia (no defect palpated to abd wall, likely rectus diastasis) is present.  Musculoskeletal: Normal range of motion.  Skin:    General: Skin is warm and dry.     Findings: No rash.  Neurological:     Mental Status: She is alert.     Motor: No abnormal muscle tone.     Coordination: Coordination normal.  Psychiatric:        Behavior: Behavior normal.           Assessment & Plan:      ICD-10-CM   1. Constipation, unspecified constipation type K59.00    push fluids, miralax sample given, if failed Rx for linzess (no samples available)  2. Generalized abdominal pain R10.84    likely secondary to constipation    1 week constipation, generalized upper abdominal pain started after constipation, hx of constipation in the past with ER visit requiring enema, she has tried docusate, passing some stool but she still feels backed up bloated and has pain across her upper abdomen.  Physical exam is unremarkable, no masses palpated, negative Murphy's.  She has had no nausea vomiting fever decreased appetite -do not suspect a GI infection, cholecystitis unlikely.  Encouraged her to use the MiraLAX sample doing 1 dose per day until having soft stools.  If that fails she can try Linzess prescription she was given a coupon.  Any worsening or change to her pain she is encouraged to follow-up   Delsa Grana, PA-C 04/03/18 3:28 PM

## 2018-04-08 ENCOUNTER — Encounter: Payer: Self-pay | Admitting: Family Medicine

## 2018-12-18 ENCOUNTER — Other Ambulatory Visit: Payer: Self-pay | Admitting: *Deleted

## 2018-12-18 DIAGNOSIS — Z20822 Contact with and (suspected) exposure to covid-19: Secondary | ICD-10-CM

## 2018-12-19 ENCOUNTER — Inpatient Hospital Stay
Admission: RE | Admit: 2018-12-19 | Discharge: 2018-12-19 | Disposition: A | Discharge status: Home / Self Care | Payer: 59 | Source: Ambulatory Visit

## 2018-12-20 ENCOUNTER — Emergency Department (HOSPITAL_COMMUNITY): Payer: Self-pay

## 2018-12-20 ENCOUNTER — Encounter (HOSPITAL_COMMUNITY): Payer: Self-pay

## 2018-12-20 ENCOUNTER — Other Ambulatory Visit: Payer: Self-pay

## 2018-12-20 ENCOUNTER — Emergency Department (HOSPITAL_COMMUNITY)
Admission: EM | Admit: 2018-12-20 | Discharge: 2018-12-20 | Disposition: A | Discharge status: Home / Self Care | Payer: Self-pay | Attending: Emergency Medicine | Admitting: Emergency Medicine

## 2018-12-20 DIAGNOSIS — M25561 Pain in right knee: Secondary | ICD-10-CM | POA: Insufficient documentation

## 2018-12-20 DIAGNOSIS — R509 Fever, unspecified: Secondary | ICD-10-CM | POA: Insufficient documentation

## 2018-12-20 DIAGNOSIS — R05 Cough: Secondary | ICD-10-CM | POA: Insufficient documentation

## 2018-12-20 LAB — CBC WITH DIFFERENTIAL/PLATELET
Abs Immature Granulocytes: 0.04 10*3/uL (ref 0.00–0.07)
Basophils Absolute: 0 10*3/uL (ref 0.0–0.1)
Basophils Relative: 0 %
Eosinophils Absolute: 0 10*3/uL (ref 0.0–0.5)
Eosinophils Relative: 0 %
HCT: 41.1 % (ref 36.0–46.0)
Hemoglobin: 13 g/dL (ref 12.0–15.0)
Immature Granulocytes: 0 %
Lymphocytes Relative: 14 %
Lymphs Abs: 1.5 10*3/uL (ref 0.7–4.0)
MCH: 27.8 pg (ref 26.0–34.0)
MCHC: 31.6 g/dL (ref 30.0–36.0)
MCV: 88 fL (ref 80.0–100.0)
Monocytes Absolute: 0.9 10*3/uL (ref 0.1–1.0)
Monocytes Relative: 9 %
Neutro Abs: 7.9 10*3/uL — ABNORMAL HIGH (ref 1.7–7.7)
Neutrophils Relative %: 77 %
Platelets: 369 10*3/uL (ref 150–400)
RBC: 4.67 MIL/uL (ref 3.87–5.11)
RDW: 12.8 % (ref 11.5–15.5)
WBC: 10.3 10*3/uL (ref 4.0–10.5)
nRBC: 0 % (ref 0.0–0.2)

## 2018-12-20 LAB — BASIC METABOLIC PANEL
Anion gap: 9 (ref 5–15)
BUN: 6 mg/dL (ref 6–20)
CO2: 23 mmol/L (ref 22–32)
Calcium: 8.6 mg/dL — ABNORMAL LOW (ref 8.9–10.3)
Chloride: 103 mmol/L (ref 98–111)
Creatinine, Ser: 0.65 mg/dL (ref 0.44–1.00)
GFR calc Af Amer: >60 mL/min (ref >60)
GFR calc non Af Amer: >60 mL/min (ref >60)
Glucose, Bld: 119 mg/dL — ABNORMAL HIGH (ref 70–99)
Potassium: 3.5 mmol/L (ref 3.5–5.1)
Sodium: 135 mmol/L (ref 135–145)

## 2018-12-20 LAB — NOVEL CORONAVIRUS, NAA: SARS-CoV-2, NAA: NOT DETECTED

## 2018-12-20 MED ORDER — HYDROCODONE-ACETAMINOPHEN 5-325 MG PO TABS: 1.0000 | ORAL_TABLET | Freq: Once | ORAL | Status: DC

## 2018-12-20 MED ORDER — HYDROCODONE-ACETAMINOPHEN 5-325 MG PO TABS: 1.0000 | ORAL_TABLET | ORAL | 0 refills | Status: DC | PRN

## 2018-12-20 MED ORDER — DICLOFENAC SODIUM 1 % TD GEL: 4.0000 g | Freq: Four times a day (QID) | TRANSDERMAL | 0 refills | Status: DC

## 2018-12-20 MED ORDER — OXYCODONE-ACETAMINOPHEN 5-325 MG PO TABS
1.0000 | ORAL_TABLET | Freq: Once | ORAL | Status: AC
  Administered 2018-12-20: 1 via ORAL
  Filled 2018-12-20: qty 1

## 2018-12-20 NOTE — ED Provider Notes (Signed)
[facility] DEPT Provider Note   CSN: [id number] Arrival date & time: 12/20/18  0020     History   Chief Complaint Chief Complaint  Patient presents with  . Joint Swelling    R knee    HPI Madeline Owens is a 33 y.o. female.     Patient to ED for evaluation of right knee pain and swelling that started 2 days ago. No injury. She states she has had a cough and fever as well with a friend who was diagnosed with COVID. She had a test for coronavirus with [facility] drive through testing that has not resulted.  No history of knee injury or swelling. No history of gout. She denies SoB, nausea or vomiting.   The history is provided by the patient. No language interpreter was used.    Past Medical History:  Diagnosis Date  . Anxiety    first pregnancy  . No pertinent past medical history   . Postpartum care following vaginal delivery 03/08/2011  . Vaginal Pap smear, abnormal     Patient Active Problem List   Diagnosis Date Noted  . Normal labor 07/24/2017  . NSVD (normal spontaneous vaginal delivery) 07/24/2017  . Nevus, non-neoplastic 06/26/2011  . Postpartum care following vaginal delivery 03/08/2011    Past Surgical History:  Procedure Laterality Date  . wisdom teeth       OB History    Gravida  3   Para  3   Term  3   Preterm  0   AB  0   Living  3     SAB  0   TAB  0   Ectopic  0   Multiple  0   Live Births  3            Home Medications    Prior to Admission medications   Medication Sig Start Date End Date Taking? Authorizing Provider  acetaminophen (TYLENOL) 500 MG tablet Take 1,000 mg by mouth every 6 (six) hours as needed.   Yes [provider]  ibuprofen (ADVIL) 200 MG tablet Take 400 mg by mouth every 6 (six) hours as needed for moderate pain.   Yes [provider]  linaclotide (LINZESS) 72 MCG capsule Take 1 capsule (72 mcg total) by mouth daily as needed (constipation). Patient not  taking: Reported on 12/20/2018 04/03/18   Delsa Grana, PA-C    Family History Family History  Problem Relation Age of Onset  . Hypertension Father   . Heart disease Father   . Diabetes Father   . Diabetes Maternal Grandmother   . Stroke Maternal Grandmother   . Diabetes Paternal Grandmother   . Hypertension Paternal Grandmother   . Diabetes Paternal Grandfather   . Heart disease Paternal Grandfather   . Hypertension Paternal Grandfather   . Heart attack Paternal Grandfather   . Thyroid disease Mother   . Kidney Stones Sister   . Melanoma Maternal Aunt   . Heart attack Paternal Uncle     Social History Social History   Tobacco Use  . Smoking status: Never Smoker  . Smokeless tobacco: Never Used  Substance Use Topics  . Alcohol use: No  . Drug use: No     Allergies   Patient has no known allergies.   Review of Systems Review of Systems  Constitutional: Negative for chills and fever.  HENT: Negative.   Respiratory: Positive for cough. Negative for shortness of breath.   Cardiovascular: Negative.  Negative  for chest pain.  Gastrointestinal: Negative.  Negative for abdominal pain, nausea and vomiting.  Musculoskeletal: Negative.        Knee pain and swelling.  Skin: Negative.  Negative for color change.  Neurological: Negative.  Negative for weakness and numbness.     Physical Exam Updated Vital Signs BP (!) 134/105 (BP Location: Right Arm)   Pulse (!) 123   Temp 99.9 F (37.7 C) (Oral)   Resp 15   Ht 5\' 2"  (1.575 m)   Wt 67.1 kg   SpO2 98%   BMI 27.07 kg/m   Physical Exam Vitals signs and nursing note reviewed.  Constitutional:      Appearance: She is well-developed.  HENT:     Head: Normocephalic.  Neck:     Musculoskeletal: Normal range of motion and neck supple.  Cardiovascular:     Rate and Rhythm: Normal rate and regular rhythm.  Pulmonary:     Effort: Pulmonary effort is normal.     Breath sounds: Normal breath sounds. No wheezing,  rhonchi or rales.  Chest:     Chest wall: No tenderness.  Abdominal:     General: Bowel sounds are normal.     Palpations: Abdomen is soft.     Tenderness: There is no abdominal tenderness. There is no guarding or rebound.  Musculoskeletal: Normal range of motion.     Comments: Left knee mildly swollen. No redness. Slightly warm to touch. Range limited due to pain. No calf or thigh tenderness.   Skin:    General: Skin is warm and dry.     Findings: No rash.  Neurological:     Mental Status: She is alert and oriented to person, place, and time.      ED Treatments / Results  Labs (all labs ordered are listed, but only abnormal results are displayed) Labs Reviewed  CBC WITH DIFFERENTIAL/PLATELET  BASIC METABOLIC PANEL    EKG None  Radiology Dg Knee Complete 4 Views Right  Result Date: 12/20/2018 CLINICAL DATA:  Right knee swelling EXAM: RIGHT KNEE - COMPLETE 4+ VIEW COMPARISON:  None. FINDINGS: No fracture or dislocation. A small knee joint effusion is seen. Normal bone mineralization is seen throughout. IMPRESSION: No acute osseous abnormality. Electronically Signed   By: Prudencio Pair M.D.   On: 12/20/2018 02:04    Procedures Procedures (including critical care time)  Medications Ordered in ED Medications  oxyCODONE-acetaminophen (PERCOCET/ROXICET) 5-325 MG per tablet 1 tablet (has no administration in time range)     Initial Impression / Assessment and Plan / ED Course  I have reviewed the triage vital signs and the nursing notes.  Pertinent labs & imaging results that were available during my care of the patient were reviewed by me and considered in my medical decision making (see chart for details).        Patient to ED with right knee pain and swelling. She became concerned because she has a friend diagnosed with COVID who also had joint pain., although, to multiple joints.   No fever, congestion or cough. Lab studies and imaging of the knee are unremarkable.  Percocet with significant improvement in knee pain.  On updating the patient she reports painful swelling to her left hand with redness. No injury. This was imaged for her as well and is found to be negative. There is redness to the central dorsum of hand, not involved with joints of hand. FROM.   She can be discharged home. She has a plan in  place to see orthopedics already for hand evaluation and is encouraged to have the knee re-examined at that time.   Final Clinical Impressions(s) / ED Diagnoses   Final diagnoses:  None   1. Right knee pain 2. Left hand pain  ED Discharge Orders    None       Charlann Lange, Hershal Coria 12/22/18 [id number]    Palumbo, April, MD 12/26/18 0004

## 2018-12-20 NOTE — ED Triage Notes (Signed)
Pt reports swelling, stiffness, and weakness in her R knee that started on Friday. She states that she had a fever on Friday as well and got a COVID test, but has not received results yet. Does not recall any injury.

## 2018-12-20 NOTE — ED Notes (Signed)
XR at bedside

## 2018-12-20 NOTE — Discharge Instructions (Addendum)
Follow up with orthopedics as planned. Use the knee immobilizer when active, bear weight as tolerated. Take Norco for severe pain and use voltaren gel topically instead of any ibuprofen for the knee and hand pain.   Return to the emergency department with any high fever, severe pain or new symptoms of concern.  You have been given COVID isolation instructions which should be followed until you have the results of your test.

## 2018-12-20 NOTE — ED Notes (Signed)
Pt verbalized discharge instructions and follow up care. Alert and ambulatory. No Iv. Husband picking pt up

## 2018-12-22 ENCOUNTER — Other Ambulatory Visit: Payer: Self-pay

## 2018-12-22 DIAGNOSIS — Z20822 Contact with and (suspected) exposure to covid-19: Secondary | ICD-10-CM

## 2018-12-24 ENCOUNTER — Telehealth: Payer: Self-pay | Admitting: *Deleted

## 2018-12-24 ENCOUNTER — Encounter: Payer: Self-pay | Admitting: Family Medicine

## 2018-12-24 LAB — NOVEL CORONAVIRUS, NAA: SARS-CoV-2, NAA: NOT DETECTED

## 2018-12-24 NOTE — Telephone Encounter (Signed)
Received call from patient.   Reports that she has x1 week of daily fever (T-max 101.8). reports that she has negative COVID testing x2. States that she is using APAP for fever with little relief. States that she has not been using IBU D/T concerns it could worsen COVID Sx. Reports that fever worsens in the afternoon/ evening. Does report some fatigue with fever.   Denies SOB, cough, N/V, sore throat.  Patient reports that she was seen in ER for pain and swelling to kneeand advised to go to Ortho on 12/20/2018. Chart review shows knee to be swollen and tender, and slightly warm to touch, but not red/ hot. WBC noted at 10.3.Patient also reports that she has knee effusion that was drained by Raliegh Ip and fluid sent for culture on Monday, 12/20/2018. Call placed to Ortho and requested results of culture to be faxed to office.   Patient is also very anxious in relation to fever. 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   Appointment scheduled for Telephealth visit.

## 2018-12-25 ENCOUNTER — Ambulatory Visit: Payer: Self-pay | Admitting: Family Medicine

## 2019-02-04 ENCOUNTER — Ambulatory Visit (INDEPENDENT_AMBULATORY_CARE_PROVIDER_SITE_OTHER): Payer: PRIVATE HEALTH INSURANCE | Admitting: Family Medicine

## 2019-02-04 ENCOUNTER — Encounter: Payer: Self-pay | Admitting: Family Medicine

## 2019-02-04 ENCOUNTER — Other Ambulatory Visit: Payer: Self-pay

## 2019-02-04 VITALS — BP 148/100 | HR 110 | Temp 96.7°F | Resp 16 | Ht 62.0 in | Wt 152.0 lb

## 2019-02-04 DIAGNOSIS — M6730 Transient synovitis, unspecified site: Secondary | ICD-10-CM

## 2019-02-04 NOTE — Progress Notes (Signed)
Subjective:    Patient ID: Madeline Owens, female    DOB: 02-Jun-1985, 33 y.o.   MRN: [id number]  HPI Patient is a very pleasant 33 year old Caucasian female who presents today to discuss some lab test.  Mid October she developed sudden onset of pain and swelling in both knees right greater than left.  She was also experiencing fever and signs of systemic illness.  She went to an orthopedic office where she had a arthrocentesis performed that did show greater than 2000 neutrophils without crystals.  Culture showed no evidence of septic arthritis.  Patient then went to an urgent care where apparently she tested positive for ehrlichiosis with a positive IgG level.  She then went to a different medical clinic in [place] where she was treated presumptively for Lyme disease with more than 28 days of doxycycline, amoxicillin, and probenecid.  Patient states that her knees feel fine now.  She has had no further fevers.  However she is confused as to the diagnosis.  Also on her lab work there was a transient elevation in her platelet counts to 490.  They were normal when she went to the urgent care earlier in October.  She also had 2 - Covid test and a negative flu test  Past Medical History:  Diagnosis Date  . Anxiety    first pregnancy  . No pertinent past medical history   . Postpartum care following vaginal delivery 03/08/2011  . Vaginal Pap smear, abnormal    Past Surgical History:  Procedure Laterality Date  . wisdom teeth     No Known Allergies  Social History   Socioeconomic History  . Marital status: Married    Spouse name: Not on file  . Number of children: Not on file  . Years of education: Not on file  . Highest education level: Not on file  Occupational History  . Not on file  Tobacco Use  . Smoking status: Never Smoker  . Smokeless tobacco: Never Used  Substance and Sexual Activity  . Alcohol use: No  . Drug use: No  . Sexual activity: Not Currently  Other Topics  Concern  . Not on file  Social History Narrative  . Not on file   Social Determinants of Health   Financial Resource Strain:   . Difficulty of Paying Living Expenses: Not on file  Food Insecurity:   . Worried About Charity fundraiser in the Last Year: Not on file  . Ran Out of Food in the Last Year: Not on file  Transportation Needs:   . Lack of Transportation (Medical): Not on file  . Lack of Transportation (Non-Medical): Not on file  Physical Activity:   . Days of Exercise per Week: Not on file  . Minutes of Exercise per Session: Not on file  Stress:   . Feeling of Stress : Not on file  Social Connections:   . Frequency of Communication with Friends and Family: Not on file  . Frequency of Social Gatherings with Friends and Family: Not on file  . Attends Religious Services: Not on file  . Active Member of Clubs or Organizations: Not on file  . Attends Archivist Meetings: Not on file  . Marital Status: Not on file  Intimate Partner Violence:   . Fear of Current or Ex-Partner: Not on file  . Emotionally Abused: Not on file  . Physically Abused: Not on file  . Sexually Abused: Not on file    Review of  Systems  All other systems reviewed and are negative.      Objective:   Physical Exam Vitals reviewed.  Constitutional:      Appearance: Normal appearance.  Cardiovascular:     Rate and Rhythm: Normal rate and regular rhythm.     Heart sounds: Normal heart sounds. No murmur.  Pulmonary:     Effort: Pulmonary effort is normal. No respiratory distress.     Breath sounds: Normal breath sounds. No wheezing, rhonchi or rales.  Neurological:     General: No focal deficit present.     Mental Status: She is alert and oriented to person, place, and time. Mental status is at baseline.     Cranial Nerves: No cranial nerve deficit.           Assessment & Plan:  Transient synovitis - Plan: CBC with Differential, COMPLETE METABOLIC PANEL WITH GFR, Sedimentation  Rate, ANA, Lyme Titer, CANCELED: Vitamin B12, CANCELED: Pathologist smear review  Patient had transient synovitis that was reactive.  The question is whether this was to an infection such as Lyme disease or whether this was to an autoimmune process such as lupus, etc.  I will check Lyme titers.  The patient has been adequately treated with doxycycline and therefore additional treatment is not necessary however if her titers are positive this would at least give [place] a diagnosis.  However if Lyme titers are negative, I would lean to a possible autoimmune disease.  Therefore I will check a sedimentation rate as well as an ANA.  If these are positive, I would continue a further work-up for diagnosis of autoimmune disease.

## 2019-02-05 ENCOUNTER — Encounter: Payer: Self-pay | Admitting: Family Medicine

## 2019-02-07 ENCOUNTER — Encounter: Payer: Self-pay | Admitting: Family Medicine

## 2019-02-07 LAB — B. BURGDORFI ANTIBODIES BY WB
B burgdorferi IgG Abs (IB): NEGATIVE
B burgdorferi IgM Abs (IB): NEGATIVE
Lyme Disease 18 kD IgG: NONREACTIVE
Lyme Disease 23 kD IgG: REACTIVE — AB
Lyme Disease 23 kD IgM: NONREACTIVE
Lyme Disease 28 kD IgG: NONREACTIVE
Lyme Disease 30 kD IgG: NONREACTIVE
Lyme Disease 39 kD IgG: NONREACTIVE
Lyme Disease 39 kD IgM: NONREACTIVE
Lyme Disease 41 kD IgG: NONREACTIVE
Lyme Disease 41 kD IgM: NONREACTIVE
Lyme Disease 45 kD IgG: NONREACTIVE
Lyme Disease 58 kD IgG: NONREACTIVE
Lyme Disease 66 kD IgG: NONREACTIVE
Lyme Disease 93 kD IgG: REACTIVE — AB

## 2019-02-07 LAB — COMPLETE METABOLIC PANEL WITH GFR
AG Ratio: 1.6 (calc) (ref 1.0–2.5)
ALT: 11 U/L (ref 6–29)
AST: 14 U/L (ref 10–30)
Albumin: 4.2 g/dL (ref 3.6–5.1)
Alkaline phosphatase (APISO): 67 U/L (ref 31–125)
BUN: 11 mg/dL (ref 7–25)
CO2: 26 mmol/L (ref 20–32)
Calcium: 9 mg/dL (ref 8.6–10.2)
Chloride: 105 mmol/L (ref 98–110)
Creat: 0.77 mg/dL (ref 0.50–1.10)
GFR, Est African American: 116 mL/min/{1.73_m2} (ref >=60)
GFR, Est Non African American: 100 mL/min/{1.73_m2} (ref >=60)
Globulin: 2.7 g/dL (calc) (ref 1.9–3.7)
Glucose, Bld: 101 mg/dL — ABNORMAL HIGH (ref 65–99)
Potassium: 4 mmol/L (ref 3.5–5.3)
Sodium: 139 mmol/L (ref 135–146)
Total Bilirubin: 0.3 mg/dL (ref 0.2–1.2)
Total Protein: 6.9 g/dL (ref 6.1–8.1)

## 2019-02-07 LAB — CBC WITH DIFFERENTIAL/PLATELET
Absolute Monocytes: 541 cells/uL (ref 200–950)
Basophils Absolute: 49 cells/uL (ref 0–200)
Basophils Relative: 0.6 %
Eosinophils Absolute: 49 cells/uL (ref 15–500)
Eosinophils Relative: 0.6 %
HCT: 39.9 % (ref 35.0–45.0)
Hemoglobin: 13.2 g/dL (ref 11.7–15.5)
Lymphs Abs: 2001 cells/uL (ref 850–3900)
MCH: 28.1 pg (ref 27.0–33.0)
MCHC: 33.1 g/dL (ref 32.0–36.0)
MCV: 85.1 fL (ref 80.0–100.0)
MPV: 10.2 fL (ref 7.5–12.5)
Monocytes Relative: 6.6 %
Neutro Abs: 5560 cells/uL (ref 1500–7800)
Neutrophils Relative %: 67.8 %
Platelets: 433 10*3/uL — ABNORMAL HIGH (ref 140–400)
RBC: 4.69 10*6/uL (ref 3.80–5.10)
RDW: 13.4 % (ref 11.0–15.0)
Total Lymphocyte: 24.4 %
WBC: 8.2 10*3/uL (ref 3.8–10.8)

## 2019-02-07 LAB — ANTI-NUCLEAR AB-TITER (ANA TITER): ANA Titer 1: 1:40 {titer} — ABNORMAL HIGH

## 2019-02-07 LAB — ANA: Anti Nuclear Antibody (ANA): POSITIVE — AB

## 2019-02-07 LAB — SEDIMENTATION RATE: Sed Rate: 9 mm/h (ref 0–20)

## 2019-03-01 ENCOUNTER — Other Ambulatory Visit: Payer: Self-pay

## 2019-03-01 ENCOUNTER — Ambulatory Visit: Payer: PRIVATE HEALTH INSURANCE | Attending: Internal Medicine

## 2019-03-01 DIAGNOSIS — Z20822 Contact with and (suspected) exposure to covid-19: Secondary | ICD-10-CM

## 2019-03-02 LAB — NOVEL CORONAVIRUS, NAA: SARS-CoV-2, NAA: DETECTED — AB

## 2019-10-28 ENCOUNTER — Ambulatory Visit: Payer: PRIVATE HEALTH INSURANCE | Admitting: Family Medicine

## 2024-03-02 ENCOUNTER — Ambulatory Visit: Payer: PRIVATE HEALTH INSURANCE | Admitting: Podiatry
